# Patient Record
Sex: Female | Born: 1975
Health system: Southern US, Community
[De-identification: ages and names within clinical notes are randomized; demographics above are authoritative.]

## PROBLEM LIST (undated history)

## (undated) DIAGNOSIS — M1990 Unspecified osteoarthritis, unspecified site: Secondary | ICD-10-CM

## (undated) DIAGNOSIS — M3500 Sicca syndrome, unspecified: Secondary | ICD-10-CM

## (undated) DIAGNOSIS — Z87441 Personal history of nephrotic syndrome: Secondary | ICD-10-CM

## (undated) DIAGNOSIS — M47819 Spondylosis without myelopathy or radiculopathy, site unspecified: Secondary | ICD-10-CM

## (undated) DIAGNOSIS — Z87448 Personal history of other diseases of urinary system: Secondary | ICD-10-CM

## (undated) DIAGNOSIS — I7300 Raynaud's syndrome without gangrene: Secondary | ICD-10-CM

## (undated) DIAGNOSIS — M461 Sacroiliitis, not elsewhere classified: Secondary | ICD-10-CM

## (undated) DIAGNOSIS — F419 Anxiety disorder, unspecified: Secondary | ICD-10-CM

## (undated) DIAGNOSIS — R768 Other specified abnormal immunological findings in serum: Secondary | ICD-10-CM

## (undated) DIAGNOSIS — Z79899 Other long term (current) drug therapy: Secondary | ICD-10-CM

## (undated) DIAGNOSIS — M79643 Pain in unspecified hand: Secondary | ICD-10-CM

## (undated) DIAGNOSIS — M79673 Pain in unspecified foot: Secondary | ICD-10-CM

## (undated) DIAGNOSIS — E785 Hyperlipidemia, unspecified: Secondary | ICD-10-CM

## (undated) DIAGNOSIS — K5190 Ulcerative colitis, unspecified, without complications: Secondary | ICD-10-CM

## (undated) HISTORY — DX: Sjogren syndrome, unspecified: M35.00

## (undated) HISTORY — DX: Other specified abnormal immunological findings in serum: R76.8

## (undated) HISTORY — PX: COLONOSCOPY: SHX174

## (undated) HISTORY — DX: Pain in unspecified foot: M79.673

## (undated) HISTORY — DX: Pain in unspecified hand: M79.643

## (undated) HISTORY — DX: Other long term (current) drug therapy: Z79.899

## (undated) HISTORY — DX: Personal history of other diseases of urinary system: Z87.448

## (undated) HISTORY — PX: TUBAL LIGATION: SHX77

## (undated) HISTORY — DX: Spondylosis without myelopathy or radiculopathy, site unspecified: M47.819

## (undated) HISTORY — DX: Personal history of nephrotic syndrome: Z87.441

## (undated) HISTORY — DX: Sacroiliitis, not elsewhere classified: M46.1

## (undated) HISTORY — DX: Raynaud's syndrome without gangrene: I73.00

## (undated) HISTORY — DX: Hyperlipidemia, unspecified: E78.5

## (undated) HISTORY — DX: Unspecified osteoarthritis, unspecified site: M19.90

## (undated) HISTORY — DX: Anxiety disorder, unspecified: F41.9

## (undated) HISTORY — DX: Ulcerative colitis, unspecified, without complications: K51.90

---
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
## 2001-03-18 HISTORY — PX: TUBAL LIGATION: SHX77

## 2010-03-18 DIAGNOSIS — M1990 Unspecified osteoarthritis, unspecified site: Secondary | ICD-10-CM

## 2010-03-18 HISTORY — DX: Unspecified osteoarthritis, unspecified site: M19.90

## 2014-02-16 ENCOUNTER — Ambulatory Visit: Payer: Self-pay | Admitting: Internal Medicine

## 2014-02-16 ENCOUNTER — Encounter: Payer: Self-pay | Admitting: *Deleted

## 2014-02-17 ENCOUNTER — Encounter: Payer: Self-pay | Admitting: Nurse Practitioner

## 2014-02-17 ENCOUNTER — Encounter: Payer: 59 | Admitting: Nurse Practitioner

## 2014-02-27 NOTE — Progress Notes (Signed)
This encounter was created in error - please disregard.

## 2014-03-03 ENCOUNTER — Ambulatory Visit (INDEPENDENT_AMBULATORY_CARE_PROVIDER_SITE_OTHER): Payer: 59 | Admitting: Nurse Practitioner

## 2014-03-03 ENCOUNTER — Encounter: Payer: Self-pay | Admitting: Nurse Practitioner

## 2014-03-03 VITALS — BP 110/66 | HR 73 | Temp 98.9°F | Resp 10 | Wt 134.0 lb

## 2014-03-03 DIAGNOSIS — R87619 Unspecified abnormal cytological findings in specimens from cervix uteri: Secondary | ICD-10-CM

## 2014-03-03 DIAGNOSIS — Z1239 Encounter for other screening for malignant neoplasm of breast: Secondary | ICD-10-CM

## 2014-03-03 DIAGNOSIS — Z87448 Personal history of other diseases of urinary system: Secondary | ICD-10-CM

## 2014-03-03 DIAGNOSIS — M069 Rheumatoid arthritis, unspecified: Secondary | ICD-10-CM | POA: Insufficient documentation

## 2014-03-03 DIAGNOSIS — M3500 Sicca syndrome, unspecified: Secondary | ICD-10-CM | POA: Insufficient documentation

## 2014-03-03 DIAGNOSIS — Z87441 Personal history of nephrotic syndrome: Secondary | ICD-10-CM

## 2014-03-03 DIAGNOSIS — K5190 Ulcerative colitis, unspecified, without complications: Secondary | ICD-10-CM | POA: Insufficient documentation

## 2014-03-03 DIAGNOSIS — M1990 Unspecified osteoarthritis, unspecified site: Secondary | ICD-10-CM

## 2014-03-03 NOTE — Patient Instructions (Addendum)
Will get referral for gastroenterologist and gynecologist Will also set up referral for mammogram and Dexa scan (bone density)  Follow up in 3 months, make am appt and fast prior to appt (no eating or drinking except black coffee and water after midnight)   Claritin (loratadine) 10 mg daily for allergies   Sndrome de Sjgren (Sjgren's Syndrome) El sndrome de Sjgren es una enfermedad en la que el sistema natural de defensa del organismo (sistema inmunitario) ataca a las propias clulas del cuerpo y a las glndulas que producen lgrimas y saliva. A veces se lo relaciona con trastornos reumticos, como artritis reumatoidea y lupus. Es 10 veces ms comn entre las mujeres que en hombres. [employer] de las personas con el sndrome de Sjgren tienen entre 46 y 54 aos. CAUSAS  La causa es desconocida. Es un problema hereditario. Es posible que un factor desencadenante, como una infeccin viral, pueda activarlo. SNTOMAS  Los principales sntomas son:  Melina Copa en la boca.  La boca se siente terrosa, como si estuviese llena de algodn.  Dificultad para tragar, hablar o saborear.  Propensin a caries e infecciones bucales.  Ojos secos.  Ardor, picazn, sensacin de Economist.  Visin borrosa.  Sensibilidad a Naval architect. Otros sntomas son:  [employer] piel, la nariz y la vagina.  Dolor en las articulaciones, rigidez y Marketing executive. Otros rganos que pueden verse afectados incluyen:  Riones.  Vasos sanguneos.  Pulmones.  Hgado.  Pncreas.  Cerebro. DIAGNSTICO  El diagnstico primero se basa en los sntomas, la historia clnica y el examen fsico. Algunas pruebas pueden ser de ayuda para realizar el diagnstico. Algunas de ellas son:  Valarie Merino de produccin de lgrimas (prueba de Schirmer).  Un examen ocular minucioso en el que se utiliza un dispositivo de aumento (examen con lmpara de hendidura).  Prueba para observar el alcance del dao ocular  utilizando una tincin.  Examen bucal para detectar la presencia de signos de hinchazn de las glndulas salivales y sequedad en la boca.  Extirpacin de una glndula salival menor desde el interior del labio inferior, a fin de estudiarla con microscopio (biopsia labial).  Anlisis de [place]. Se llevarn a cabo y se controlarn anlisis de sangre especiales y [place]. Pueden presentarse anticuerpos que atacan tejidos normales (autoanticuerpos), como anticuerpos antinucleares (AAN), factores reumatoideos y anticuerpos del sndrome de Sjgren (anti-SSA y anti-SSB).  Radiografa de trax.  Anlisis de [place]. TRATAMIENTO  No se conoce la cura para este sndrome. Tampoco existe un tratamiento especfico para restablecer la secrecin glandular. El tratamiento vara y, generalmente, se basa en los sntomas presentes.  Las terapias con sustitutos de humedad pueden [employer] sntomas de sequedad.  Se pueden utilizar antiinflamatorios no esteroideos (AINE), como el ibuprofeno, para tratar sntomas musculoesquelticos.  Los corticoides, como la prednisona, se pueden Architectural technologist a Engineer, manufacturing con Doctor, hospital.  Se pueden recetar medicamentos inmunosupresores para controlar la sobreactividad del sistema inmunitario. En casos graves, esta sobreactividad puede daar rganos.  Se puede considerar un procedimiento quirrgico denominado oclusin puntual. Se realiza para cerrar los conductos que drenan las lgrimas, ayudando a Theatre manager ms lgrimas naturales en la superficie del ojo. Un pequeo porcentaje de las personas con sndrome de Film/video editor linfoma. Si est preocupado porque podra desarrollar linfoma, hable con el mdico a fin de conocer ms sobre la enfermedad y los sntomas que se deben Programme researcher, broadcasting/film/video. INSTRUCCIONES PARA EL CUIDADO EN EL HOGAR   Cuidado de los ojos:  Use gotas oftlmicas  segn lo especificado por el mdico.  Trate de parpadear 5 a 6 veces por  minuto.  Proteja los ojos de corrientes de aire y brizas.  Mantenga el aire apropiadamente humidificado.  Evite el humo.  Cuidado de la boca:  La goma de Higher education careers adviser sin azcar y los caramelos duros pueden ayudar a Psychologist, clinical.  Tome sorbos de agua con frecuencia o bebidas sin azcar.  Use blsamo labial, sustitutos de saliva y medicamentos recetados, segn las indicaciones. SOLICITE ATENCIN MDICA SI:   Su temperatura bucal es de ms de 38,9 C (102 F).  Transpira por las noches.  Tiene fatiga constante.  Pierde peso de [place] inexplicable.  Siente picazn.  Tiene manchas rojas en la piel. PARA OBTENER MS INFORMACIN  Fundacin del Sndrome de Sjgren (Sjgren's Syndrome Foundation): www.sjogrens.Mercer para la Artritis y las Enfermedades Musculoesquelticas y Insurance underwriter [facility] of Arthritis and Musculoskeletal and Skin Diseases): www.niams.SouthExposed.es. Document Released: 12/23/2012 [facility] Patient Information 2015 [place]. This information is not intended to replace advice given to you by your health care provider. Make sure you discuss any questions you have with your health care provider.

## 2014-03-03 NOTE — Progress Notes (Signed)
Patient ID: Laura Finley, female   DOB: 1975-08-10, 38 y.o.   MRN: [id number] .    PCP: Lauree Chandler, NP  Allergies  Allergen Reactions  . Iodine     Chief Complaint  Patient presents with  . Establish Care    New patient establish care: Request GI referral for ulcerative colitis  . Referral    GYN referral      HPI: Patient is a 38 y.o. female seen in the office today to establish care.  Pt from [place]  and needing to establish care here. Was relocated due to husbands job.  Needs referral to GYN and GI Had had abnormal PAP in the past - was supposed to follow up in 2 months (this was in June) she is now over due for this.  Already established with rheumatologist. Had labs when she went to rheumatologist -was dx with sjogren's syndrome.  Rheumatologist in [place] started her on folic acid so she conts now Following up wit rheumatologist in 2 month Last monogram last in may 2015- follow up in 6 month (past due now)  Exercising 3 times a week 30 mins-45 mins  Has been on prednisone off and on for the last 12 years Review of Systems:  Review of Systems  Constitutional: Negative for activity change, appetite change, fatigue and unexpected weight change.  HENT: Positive for congestion (at times due to allergies). Negative for hearing loss.   Eyes: Negative.        Dry eyes, using eye drops  Respiratory: Negative for cough and shortness of breath.   Cardiovascular: Negative for chest pain, palpitations and leg swelling.  Gastrointestinal: Positive for abdominal pain (mild). Negative for diarrhea and constipation.  Genitourinary: Negative for dysuria and difficulty urinating.  Musculoskeletal: Positive for joint swelling and arthralgias. Negative for myalgias.       Chronic pain, due to reactive arthritis   Skin: Negative for color change and wound.  Allergic/Immunologic: Positive for environmental allergies.  Neurological: Negative for dizziness  and weakness.  Psychiatric/Behavioral: Negative for behavioral problems, confusion and agitation. The patient is nervous/anxious.        Situational anxiety, moving here from [place]. Was working in [place], has no job getting use to the change.    Past Medical History  Diagnosis Date  . Arthritis 2012    reactive arthritis  . History of kidney disease as a child    History reviewed. No pertinent past surgical history. Social History:   reports that she has never smoked. She does not have any smokeless tobacco history on file. She reports that she does not drink alcohol or use illicit drugs.  Family History  Problem Relation Age of Onset  . Cancer Mother 81    Breast  . Cancer Father 52    bone  . Ulcerative colitis Brother 71    Medications: Patient's Medications  New Prescriptions   No medications on file  Previous Medications   ACETAMINOPHEN (TYLENOL) 500 MG TABLET    Take 1,000 mg by mouth every 12 (twelve) hours as needed.   ADALIMUMAB (HUMIRA) 40 MG/0.8ML PSKT    Inject 40 mg into the skin every 14 (fourteen) days.   BIOTIN (BIOTIN MAXIMUM STRENGTH) 10 MG TABS    Take 10 mg by mouth daily.   FOLIC ACID (FOLVITE) 1 MG TABLET    Take 1 mg by mouth daily.  Modified Medications   No medications on file  Discontinued Medications   IBUPROFEN (  ADVIL,MOTRIN) 200 MG TABLET    Take 400 mg by mouth every 12 (twelve) hours as needed.   METHYLPREDNISOLONE (MEDROL) 16 MG TABLET    Take 16 mg by mouth daily as needed.   PREDNISONE (DELTASONE) 5 MG TABLET    Take 5 mg by mouth daily with breakfast.     Physical Exam:  Filed Vitals:   03/03/14 0909  BP: 110/66  Pulse: 73  Temp: 98.9 F (37.2 C)  TempSrc: Oral  Resp: 10  Weight: 134 lb (60.782 kg)  SpO2: 96%    Physical Exam  Constitutional: She is oriented to person, place, and time. She appears well-developed and well-nourished. No distress.  HENT:  Head: Normocephalic and atraumatic.  Mouth/Throat:  Oropharynx is clear and moist. No oropharyngeal exudate.  Eyes: Conjunctivae are normal. Pupils are equal, round, and reactive to light.  Neck: Normal range of motion. Neck supple.  Cardiovascular: Normal rate, regular rhythm and normal heart sounds.   Pulmonary/Chest: Effort normal and breath sounds normal.  Abdominal: Soft. Bowel sounds are normal.  Musculoskeletal: She exhibits no edema or tenderness.  Neurological: She is alert and oriented to person, place, and time.  Skin: Skin is warm and dry. She is not diaphoretic.  Psychiatric: She has a normal mood and affect.    Labs reviewed: Basic Metabolic Panel: No results for input(s): NA, K, CL, CO2, GLUCOSE, BUN, CREATININE, CALCIUM, MG, PHOS, TSH in the last 8760 hours. Liver Function Tests: No results for input(s): AST, ALT, ALKPHOS, BILITOT, PROT, ALBUMIN in the last 8760 hours. No results for input(s): LIPASE, AMYLASE in the last 8760 hours. No results for input(s): AMMONIA in the last 8760 hours. CBC: No results for input(s): WBC, NEUTROABS, HGB, HCT, MCV, PLT in the last 8760 hours. Lipid Panel: No results for input(s): CHOL, HDL, LDLCALC, TRIG, CHOLHDL, LDLDIRECT in the last 8760 hours. TSH: No results for input(s): TSH in the last 8760 hours. A1C: No results found for: HGBA1C   Assessment/Plan  1. Arthritis Reactive arthritis followed by rheumatology, on humira - will follow up HM DEXA SCAN due to chronic prednisone use  2. Ulcerative colitis, without complications -without symptoms at this time except for slight left lower abdominal discomfort, not pain, bowels moving regularly.  - Ambulatory referral to Gastroenterology  3. Sjogren's syndrome Recently diagnosed Following with rhuematology  4. History of kidney disease as a child -was following with urology [place] but resolved  5. Abnormal cervical Papanicolaou smear, unspecified abnormal pap finding - Ambulatory referral to Gynecology  6. Breast  cancer screening - MM DIGITAL SCREENING BILATERAL; Future  7. Allergic rhinitis  To use claritin 10 mg daily   Next visit will come fasting, will get fasting lipid, cmp, cbc

## 2014-03-07 ENCOUNTER — Ambulatory Visit: Payer: Self-pay | Admitting: Gastroenterology

## 2014-03-07 NOTE — Progress Notes (Signed)
Patient ID: Laura Finley, female   DOB: 22-Aug-1975, 38 y.o.   MRN: [id number]     Patient no-showed today's appointment; provider notified for review of record.     Please send a no-show letter to the patient recommending that they reschedule the appointment.   Also send a letter to the referring provider alerting them of the NO SHOW.

## 2014-03-31 ENCOUNTER — Other Ambulatory Visit: Payer: Self-pay | Admitting: Nurse Practitioner

## 2014-03-31 DIAGNOSIS — N63 Unspecified lump in unspecified breast: Secondary | ICD-10-CM

## 2014-04-25 ENCOUNTER — Encounter: Payer: Self-pay | Admitting: Gastroenterology

## 2014-04-25 ENCOUNTER — Ambulatory Visit (INDEPENDENT_AMBULATORY_CARE_PROVIDER_SITE_OTHER): Payer: 59 | Admitting: Gastroenterology

## 2014-04-25 VITALS — BP 110/60 | HR 87 | Ht 65.0 in | Wt 135.4 lb

## 2014-04-25 DIAGNOSIS — K5190 Ulcerative colitis, unspecified, without complications: Secondary | ICD-10-CM

## 2014-04-25 MED ORDER — HYOSCYAMINE SULFATE 0.125 MG SL SUBL: SUBLINGUAL_TABLET | SUBLINGUAL | Status: DC

## 2014-04-25 NOTE — Patient Instructions (Addendum)
Avoid NSAIDs as best that you can. Please return to see Dr. Ardis Hughs in 2-3 months, sooner if needed. Continue your humira every other week, 14m. Have a copy any labs sent here for review. New prescription for antispasm med called in.

## 2014-04-25 NOTE — Progress Notes (Signed)
HPI: This is a  very pleasant 39 year old woman whom I'm meeting for the first time today. She and her husband recently moved here from [place]. He is here with her for the visit today.  Colonoscopy January 2014, [place], described to have a normal cecum and ileocecal valve, there was minimal erythema throughout the colon, random biopsies were taken. Biopsies showed active ulcerative colitis without dysplasia. Colonoscopy September 2012 in [place], described as having a normal terminal ileum. TI was biopsied to rule out inflammation. There was mild erythema and granularity throughout the entire colon. Random biopsies were taken. Biopsies showed mild chronic ileitis and active colitis. Colonoscopy in 2003 and 2005. These were both in [place]. I only have hand written reports and they are completely illegible. A biopsy from the 2003 colonoscopy does show chronic, active colitis.   Was pregnant, diagnosed with UC in 2003. First med with asocol and rowasa.  Then off meds for 8 years without any symptoms (no diarrhea). Started to flare in 2012 (bloody diarrhea), was on steroids for about a year, restarted asacol.  Reactive arthritis (migratory large joints).  She started on humira 2012 by rheumtologist. Everything improved (joints and GI symptoms).  See's Laura Headland, MD. She is prescribing humira.  Saw her even last weeks.  Next visit in 6 months.  Bowels once to twice a day, non bloody, minor left sided abd pains.  No colon cancer  Blood work dated October 2015 show positive ANA, very elevated Sjogren's anti-SS-a, homogeneous pattern of the ANA to a very high titer of 03-1278. Quant if you're on gold TB testing was negative. Hepatitis B surface antigen was negative, hepatitis be surface antibody was nonreactive. Sedimentation rate was negative normal.  Two children 26 yo, 45 yo. Goes to [employer].  Review of systems: Pertinent positive and negative review of systems were noted in  the above HPI section. Complete review of systems was performed and was otherwise normal.    Past Medical History  Diagnosis Date  . Arthritis 2012    reactive arthritis  . History of kidney disease as a child   . Ulcerative colitis   . Sjogren's syndrome     Past Surgical History  Procedure Laterality Date  . Tubal ligation      Current Outpatient Prescriptions  Medication Sig Dispense Refill  . acetaminophen (TYLENOL) 500 MG tablet Take 1,000 mg by mouth every 12 (twelve) hours as needed.    . Adalimumab (HUMIRA) 40 MG/0.8ML PSKT Inject 40 mg into the skin every 14 (fourteen) days.    . Biotin (BIOTIN MAXIMUM STRENGTH) 10 MG TABS Take 10 mg by mouth daily.    . folic acid (FOLVITE) 1 MG tablet Take 1 mg by mouth daily.     No current facility-administered medications for this visit.    Allergies as of 04/25/2014 - Review Complete 04/25/2014  Allergen Reaction Noted  . Iodine  02/16/2014    Family History  Problem Relation Age of Onset  . Cancer Mother 52    Breast  . Hypertension Mother   . Cancer Father 18    bone  . Ulcerative colitis Brother 76  . Diabetes Brother   . Diabetes Maternal Grandmother     History   Social History  . Marital Status: Married    Spouse Name: N/A    Number of Children: N/A  . Years of Education: N/A   Occupational History  . Not on file.   Social History Main Topics  .  Smoking status: Never Smoker   . Smokeless tobacco: Not on file  . Alcohol Use: No     Comment: occ glass of wine  . Drug Use: No  . Sexual Activity: Yes    Birth Control/ Protection: Surgical   Other Topics Concern  . Not on file   Social History Narrative       Physical Exam: BP 110/60 mmHg  Pulse 87  Ht 5' 5"  (1.651 m)  Wt 135 lb 6.4 oz (61.417 kg)  BMI 22.53 kg/m2  SpO2 99% Constitutional: generally well-appearing Psychiatric: alert and oriented x3 Eyes: extraocular movements intact Mouth: oral pharynx moist, no lesions Neck: supple  no lymphadenopathy Cardiovascular: heart regular rate and rhythm Lungs: clear to auscultation bilaterally Abdomen: soft, nontender, nondistended, no obvious ascites, no peritoneal signs, normal bowel sounds Extremities: no lower extremity edema bilaterally Skin: no lesions on visible extremities    Assessment and plan: 39 y.o. female with  ulcerative colitis, in very good clinical remission on Humira every other week  She is currently on biologic therapy and this is helping her ulcerative colitis as well as her reactive arthritis, Sjogren's syndrome. I see no reason to change her medicines currently. She will return to see me in 2-3 months and sooner if needed. She does have some mild left-sided spasm-like abdominal pains and I'm calling her in a prescription for sublingual antispasm medicines. She knows to avoid NSAIDs as best that she can.   At some point she will probably need repeat colonoscopy for high-risk screening. She was diagnosed originally in 2003 and so she is about 13 years from her onset of disease. Probably the next 2-3 years like to repeat colonoscopy for screening.

## 2014-05-30 ENCOUNTER — Other Ambulatory Visit: Payer: 59

## 2014-05-30 ENCOUNTER — Other Ambulatory Visit: Payer: Self-pay | Admitting: *Deleted

## 2014-05-30 DIAGNOSIS — M3500 Sicca syndrome, unspecified: Secondary | ICD-10-CM

## 2014-05-30 DIAGNOSIS — Z87441 Personal history of nephrotic syndrome: Secondary | ICD-10-CM

## 2014-05-30 DIAGNOSIS — K5190 Ulcerative colitis, unspecified, without complications: Secondary | ICD-10-CM

## 2014-05-31 LAB — COMPREHENSIVE METABOLIC PANEL
ALK PHOS: 60 IU/L (ref 39–117)
ALT: 10 IU/L (ref 0–32)
AST: 16 IU/L (ref 0–40)
Albumin/Globulin Ratio: 1.7 (ref 1.1–2.5)
Albumin: 4.6 g/dL (ref 3.5–5.5)
BUN / CREAT RATIO: 17 (ref 8–20)
BUN: 13 mg/dL (ref 6–20)
Bilirubin Total: 0.3 mg/dL (ref 0.0–1.2)
CO2: 20 mmol/L (ref 18–29)
Calcium: 9.8 mg/dL (ref 8.7–10.2)
Chloride: 103 mmol/L (ref 97–108)
Creatinine, Ser: 0.78 mg/dL (ref 0.57–1.00)
GFR calc non Af Amer: 97 mL/min/{1.73_m2} (ref >59)
GFR, EST AFRICAN AMERICAN: 112 mL/min/{1.73_m2} (ref >59)
GLUCOSE: 85 mg/dL (ref 65–99)
Globulin, Total: 2.7 g/dL (ref 1.5–4.5)
Potassium: 4.1 mmol/L (ref 3.5–5.2)
Sodium: 138 mmol/L (ref 134–144)
Total Protein: 7.3 g/dL (ref 6.0–8.5)

## 2014-05-31 LAB — CBC WITH DIFFERENTIAL/PLATELET
BASOS ABS: 0 10*3/uL (ref 0.0–0.2)
Basos: 0 %
Eos: 3 %
Eosinophils Absolute: 0.1 10*3/uL (ref 0.0–0.4)
HCT: 40.1 % (ref 34.0–46.6)
Hemoglobin: 13 g/dL (ref 11.1–15.9)
Immature Grans (Abs): 0 10*3/uL (ref 0.0–0.1)
Immature Granulocytes: 0 %
LYMPHS ABS: 1.7 10*3/uL (ref 0.7–3.1)
Lymphs: 35 %
MCH: 26.6 pg (ref 26.6–33.0)
MCHC: 32.4 g/dL (ref 31.5–35.7)
MCV: 82 fL (ref 79–97)
Monocytes Absolute: 0.4 10*3/uL (ref 0.1–0.9)
Monocytes: 7 %
Neutrophils Absolute: 2.8 10*3/uL (ref 1.4–7.0)
Neutrophils Relative %: 55 %
Platelets: 225 10*3/uL (ref 150–379)
RBC: 4.89 x10E6/uL (ref 3.77–5.28)
RDW: 14.9 % (ref 12.3–15.4)
WBC: 5 10*3/uL (ref 3.4–10.8)

## 2014-05-31 LAB — LIPID PANEL
CHOL/HDL RATIO: 3.1 ratio (ref 0.0–4.4)
CHOLESTEROL TOTAL: 179 mg/dL (ref 100–199)
HDL: 58 mg/dL (ref >39)
LDL Calculated: 105 mg/dL — ABNORMAL HIGH (ref 0–99)
Triglycerides: 80 mg/dL (ref 0–149)
VLDL CHOLESTEROL CAL: 16 mg/dL (ref 5–40)

## 2014-06-02 ENCOUNTER — Encounter: Payer: Self-pay | Admitting: Nurse Practitioner

## 2014-06-02 ENCOUNTER — Ambulatory Visit (INDEPENDENT_AMBULATORY_CARE_PROVIDER_SITE_OTHER): Payer: 59 | Admitting: Nurse Practitioner

## 2014-06-02 VITALS — BP 110/66 | HR 75 | Temp 98.6°F | Resp 12 | Ht 65.0 in | Wt 132.0 lb

## 2014-06-02 DIAGNOSIS — K5190 Ulcerative colitis, unspecified, without complications: Secondary | ICD-10-CM

## 2014-06-02 DIAGNOSIS — E785 Hyperlipidemia, unspecified: Secondary | ICD-10-CM | POA: Diagnosis not present

## 2014-06-02 DIAGNOSIS — H578 Other specified disorders of eye and adnexa: Secondary | ICD-10-CM | POA: Diagnosis not present

## 2014-06-02 DIAGNOSIS — Z1239 Encounter for other screening for malignant neoplasm of breast: Secondary | ICD-10-CM | POA: Diagnosis not present

## 2014-06-02 DIAGNOSIS — F411 Generalized anxiety disorder: Secondary | ICD-10-CM

## 2014-06-02 DIAGNOSIS — H5789 Other specified disorders of eye and adnexa: Secondary | ICD-10-CM

## 2014-06-02 DIAGNOSIS — M1990 Unspecified osteoarthritis, unspecified site: Secondary | ICD-10-CM

## 2014-06-02 NOTE — Patient Instructions (Signed)
Please see ophthalmologist for next visit  [facility]: Deliah Goody MD ? WebsiteDirections Doctor Address: [address], [place], [place] [address] [phone]  Or   Suann Larry MD ? [facility]irections Ophthalmologist Address: [address], [place], [place] [address] [phone]  Follow up in 1 year or sooner if needed

## 2014-06-02 NOTE — Progress Notes (Signed)
Patient ID: Laura Finley, female   DOB: [birth year], 39 y.o.   MRN: [id number]    PCP: Lauree Chandler, NP  Allergies  Allergen Reactions  . Iodine     Chief Complaint  Patient presents with  . Medical Management of Chronic Issues    3 month follow-up      HPI: Patient is a 39 y.o. female seen in the office today for routine follow up. Followed up by Dr Ardis Hughs, GI. No abdominal pain noted. Eating better so she does not have GI side effects. No diarrhea or constipation.  No joint pain. Seeing rheumatologist every 3 months but now every 6 months since she is doing well. Reports recent labs from rheumatology were good.   Had worsening red splotches, went to unscented lotions and detergents, now they are better-- rheumatologist though it may be psoriasis Followed up with GYN, had breast exam Scheduled for mammogram with GYN office which will be done in may Her mother had hx of breast cancer 12 years ago, now has mets to her bones, freq travels back home to see her.   Review of Systems:  Review of Systems  Constitutional: Negative for activity change, appetite change, fatigue and unexpected weight change.  HENT: Negative for congestion (at times due to allergies) and hearing loss.   Eyes: Negative.        Dry eyes, using eye drops  Respiratory: Negative for cough and shortness of breath.   Cardiovascular: Negative for chest pain, palpitations and leg swelling.  Gastrointestinal: Negative for abdominal pain, diarrhea and constipation.  Genitourinary: Negative for dysuria and difficulty urinating.  Musculoskeletal: Positive for joint swelling and arthralgias. Negative for myalgias.       Chronic pain, due to reactive arthritis   Skin: Negative for color change and wound.  Allergic/Immunologic: Positive for environmental allergies.  Neurological: Negative for dizziness and weakness.  Psychiatric/Behavioral: Negative for behavioral problems, confusion and agitation. The  patient is nervous/anxious.        Situational anxiety, due to move and mother who has cancer    Past Medical History  Diagnosis Date  . Arthritis 2012    reactive arthritis  . History of kidney disease as a child   . Ulcerative colitis   . Sjogren's syndrome    Past Surgical History  Procedure Laterality Date  . Tubal ligation     Social History:   reports that she has never smoked. She does not have any smokeless tobacco history on file. She reports that she does not drink alcohol or use illicit drugs.  Family History  Problem Relation Age of Onset  . Cancer Mother 29    Breast  . Hypertension Mother   . Cancer Father 60    bone  . Ulcerative colitis Brother 88  . Diabetes Brother   . Diabetes Maternal Grandmother     Medications: Patient's Medications  New Prescriptions   No medications on file  Previous Medications   ACETAMINOPHEN (TYLENOL) 500 MG TABLET    Take 1,000 mg by mouth every 12 (twelve) hours as needed.   ADALIMUMAB (HUMIRA) 40 MG/0.8ML PSKT    Inject 40 mg into the skin every 14 (fourteen) days.   BIOTIN (BIOTIN MAXIMUM STRENGTH) 10 MG TABS    Take 10 mg by mouth daily.   FOLIC ACID (FOLVITE) 1 MG TABLET    Take 1 mg by mouth daily.   HYOSCYAMINE (LEVSIN SL) 0.125 MG SL TABLET    Take 1-2 tablets by mouth  or under tongue every 4-6 hours as needed  Modified Medications   No medications on file  Discontinued Medications   No medications on file     Physical Exam:  Filed Vitals:   06/02/14 1118  BP: 110/66  Pulse: 75  Temp: 98.6 F (37 C)  TempSrc: Oral  Resp: 12  Height: 5' 5"  (1.651 m)  Weight: 132 lb (59.875 kg)  SpO2: 99%    Physical Exam  Constitutional: She is oriented to person, place, and time. She appears well-developed and well-nourished. No distress.  HENT:  Head: Normocephalic and atraumatic.  Mouth/Throat: Oropharynx is clear and moist. No oropharyngeal exudate.  Eyes: Conjunctivae are normal. Pupils are equal, round, and  reactive to light.  Neck: Normal range of motion. Neck supple.  Cardiovascular: Normal rate, regular rhythm and normal heart sounds.   Pulmonary/Chest: Effort normal and breath sounds normal.  Abdominal: Soft. Bowel sounds are normal.  Musculoskeletal: She exhibits no edema or tenderness.  Neurological: She is alert and oriented to person, place, and time.  Skin: Skin is warm and dry. She is not diaphoretic.  Psychiatric: She has a normal mood and affect.    Labs reviewed: Basic Metabolic Panel:  Recent Labs  05/30/14 0919  NA 138  K 4.1  CL 103  CO2 20  GLUCOSE 85  BUN 13  CREATININE 0.78  CALCIUM 9.8   Liver Function Tests:  Recent Labs  05/30/14 0919  AST 16  ALT 10  ALKPHOS 60  BILITOT 0.3  PROT 7.3   No results for input(s): LIPASE, AMYLASE in the last 8760 hours. No results for input(s): AMMONIA in the last 8760 hours. CBC:  Recent Labs  05/30/14 0919  WBC 5.0  NEUTROABS 2.8  HGB 13.0  HCT 40.1  MCV 82  PLT 225   Lipid Panel:  Recent Labs  05/30/14 0919  CHOL 179  HDL 58  LDLCALC 105*  TRIG 80  CHOLHDL 3.1   TSH: No results for input(s): TSH in the last 8760 hours. A1C: No results found for: HGBA1C   Assessment/Plan 1. Ulcerative colitis, without complications -well controlled, following with GI  2. Arthritis conts to follow up with rheumatology, currently without symptoms    3. Breast cancer screening -following with GYN for PAP and routine exa and plans to get Mammogram at GYN office  4. Anxiety state -due to move and mother with breast cancer with mets, plan to go back to [place] to visit her frequently, coping appropriately.    5. Hyperlipidemia LDL slightly elevated, reviewed lipid panel and diet and exercise goals.  Lifestyle modifications for now, will follow up   6. Red eyes -reports red eyes for some time now. Does not bother her. No visual changes, recently went to optometrist for last visit -recommended  ophthalmologist for next eye exam  Follow up in 1 years, sooner if needed

## 2014-06-05 DIAGNOSIS — E785 Hyperlipidemia, unspecified: Secondary | ICD-10-CM | POA: Insufficient documentation

## 2014-06-05 DIAGNOSIS — F411 Generalized anxiety disorder: Secondary | ICD-10-CM | POA: Insufficient documentation

## 2014-06-05 DIAGNOSIS — Z1239 Encounter for other screening for malignant neoplasm of breast: Secondary | ICD-10-CM | POA: Insufficient documentation

## 2014-10-11 ENCOUNTER — Other Ambulatory Visit: Payer: Self-pay | Admitting: Gynecology

## 2014-10-11 DIAGNOSIS — N6320 Unspecified lump in the left breast, unspecified quadrant: Principal | ICD-10-CM

## 2014-10-11 DIAGNOSIS — N6325 Unspecified lump in the left breast, overlapping quadrants: Secondary | ICD-10-CM

## 2014-10-11 DIAGNOSIS — Z803 Family history of malignant neoplasm of breast: Secondary | ICD-10-CM

## 2014-10-17 ENCOUNTER — Other Ambulatory Visit: Payer: 59

## 2014-10-20 ENCOUNTER — Ambulatory Visit
Admission: RE | Admit: 2014-10-20 | Discharge: 2014-10-20 | Disposition: A | Discharge status: Home / Self Care | Payer: 59 | Source: Ambulatory Visit | Attending: Gynecology | Admitting: Gynecology

## 2014-10-20 ENCOUNTER — Other Ambulatory Visit: Payer: Self-pay | Admitting: Gynecology

## 2014-10-20 DIAGNOSIS — N6320 Unspecified lump in the left breast, unspecified quadrant: Principal | ICD-10-CM

## 2014-10-20 DIAGNOSIS — Z803 Family history of malignant neoplasm of breast: Secondary | ICD-10-CM

## 2014-10-20 DIAGNOSIS — N6325 Unspecified lump in the left breast, overlapping quadrants: Secondary | ICD-10-CM

## 2015-03-17 ENCOUNTER — Encounter: Payer: Self-pay | Admitting: Gastroenterology

## 2015-03-17 ENCOUNTER — Ambulatory Visit (INDEPENDENT_AMBULATORY_CARE_PROVIDER_SITE_OTHER): Payer: 59 | Admitting: Gastroenterology

## 2015-03-17 VITALS — BP 100/70 | HR 72 | Ht 65.0 in | Wt 135.6 lb

## 2015-03-17 DIAGNOSIS — K5190 Ulcerative colitis, unspecified, without complications: Secondary | ICD-10-CM

## 2015-03-17 NOTE — Progress Notes (Signed)
Review of pertinent gastrointestinal problems: 1. Extensive ulcerative colitis: Was pregnant, diagnosed with UC in 2003. First med with asocol and rowasa. Then off meds for 8 years without any symptoms (no diarrhea). Started to flare in 2012 (bloody diarrhea), was on steroids for about a year, restarted asacol. Reactive arthritis (migratory large joints). She started on humira 2012 by rheumtologist. Everything improved (joints and GI symptoms).Colonoscopy January 2014, [place], described to have a normal cecum and ileocecal valve, there was minimal erythema throughout the colon, random biopsies were taken. Biopsies showed active ulcerative colitis without dysplasia. Colonoscopy September 2012 in [place], described as having a normal terminal ileum. TI was biopsied to rule out inflammation. There was mild erythema and granularity throughout the entire colon. Random biopsies were taken. Biopsies showed mild chronic ileitis and active colitis. Colonoscopy in 2003 and 2005. These were both in [place]. I only have hand written reports and they are completely illegible. A biopsy from the 2003 colonoscopy does show chronic, active colitis.   Established care Dr. Ardis Hughs 2016, no change in meds, continue humira every other week.    HPI: This is a    very pleasant 39 year old woman whom I last saw figure 2016. She is here with her husband today.  She continues on Humira every other week 40 mg. This was prescribed by her rheumatologist  Bowels are fine. No bleeding.  Minor diarrhea especially if cold out, or chilly in the house.    Usually solid formed BM once daily.  Loose stools intermittently about once a month, usually dietary related.   Her brother also has UC. Chief complaint is extensive ulcerative colitis   Past Medical History  Diagnosis Date  . Arthritis 2012    reactive arthritis  . History of kidney disease as a child   . Ulcerative colitis ([place])   . Sjogren's syndrome  [facility])     Past Surgical History  Procedure Laterality Date  . Tubal ligation      Current Outpatient Prescriptions  Medication Sig Dispense Refill  . acetaminophen (TYLENOL) 500 MG tablet Take 1,000 mg by mouth every 12 (twelve) hours as needed.    . Adalimumab (HUMIRA) 40 MG/0.8ML PSKT Inject 40 mg into the skin every 14 (fourteen) days.    . Biotin (BIOTIN MAXIMUM STRENGTH) 10 MG TABS Take 10 mg by mouth daily.    . folic acid (FOLVITE) 1 MG tablet Take 1 mg by mouth daily.    . hyoscyamine (LEVSIN SL) 0.125 MG SL tablet Take 1-2 tablets by mouth or under tongue every 4-6 hours as needed 50 tablet 6  . NON FORMULARY 5 HTP vitamin supplement     No current facility-administered medications for this visit.    Allergies as of 03/17/2015 - Review Complete 03/17/2015  Allergen Reaction Noted  . Iodine  02/16/2014    Family History  Problem Relation Age of Onset  . Cancer Mother 75    Breast  . Hypertension Mother   . Cancer Father 39    bone  . Ulcerative colitis Brother 81  . Diabetes Brother   . Diabetes Maternal Grandmother     Social History   Social History  . Marital Status: Married    Spouse Name: N/A  . Number of Children: N/A  . Years of Education: N/A   Occupational History  . Not on file.   Social History Main Topics  . Smoking status: Never Smoker   . Smokeless tobacco: Never Used  . Alcohol  Use: No     Comment: occ glass of wine  . Drug Use: No  . Sexual Activity: Yes    Birth Control/ Protection: Surgical   Other Topics Concern  . Not on file   Social History Narrative     Physical Exam: BP 100/70 mmHg  Pulse 72  Ht 5' 5"  (1.651 m)  Wt 135 lb 9.6 oz (61.508 kg)  BMI 22.57 kg/m2  LMP 02/23/2015 (Approximate) Constitutional: generally well-appearing Psychiatric: alert and oriented x3 Abdomen: soft, nontender, nondistended, no obvious ascites, no peritoneal signs, normal bowel sounds   Assessment and plan: 39 y.o. female with  extensive ulcerative colitis  Her bowel symptoms are well controlled on Humira every other week. She will continue this for now without any changes. The medicine has been prescribed by her rheumatologist as she also has a reactive arthritis. She will return to see me in 6 months and sooner if needed. She is probably due for high risk screening colonoscopy in the next year or 2.   Owens Loffler, MD [facility] 03/17/2015, 11:09 AM

## 2015-03-17 NOTE — Patient Instructions (Signed)
Please return to see Dr. Ardis Hughs in 6 months.  No changes in your humira.

## 2015-05-11 ENCOUNTER — Encounter: Payer: Self-pay | Admitting: Nurse Practitioner

## 2015-06-01 ENCOUNTER — Ambulatory Visit: Payer: 59 | Admitting: Nurse Practitioner

## 2015-06-28 ENCOUNTER — Encounter: Payer: Self-pay | Admitting: Gastroenterology

## 2015-08-29 ENCOUNTER — Encounter: Payer: 59 | Admitting: Nurse Practitioner

## 2015-09-11 ENCOUNTER — Encounter: Payer: Self-pay | Admitting: Nurse Practitioner

## 2015-09-11 ENCOUNTER — Ambulatory Visit (INDEPENDENT_AMBULATORY_CARE_PROVIDER_SITE_OTHER): Payer: 59 | Admitting: Nurse Practitioner

## 2015-09-11 VITALS — BP 118/68 | HR 70 | Temp 98.3°F | Resp 18 | Ht 65.0 in | Wt 130.6 lb

## 2015-09-11 DIAGNOSIS — Z0000 Encounter for general adult medical examination without abnormal findings: Secondary | ICD-10-CM | POA: Diagnosis not present

## 2015-09-11 NOTE — Progress Notes (Signed)
Patient ID: Melissa Montane, female   DOB: 07/31/1975, 40 y.o.   MRN: [id number]    PCP: Lauree Chandler, NP  Advanced Directive information Does patient have an advance directive?: No, Would patient like information on creating an advanced directive?: No - patient declined information  Allergies  Allergen Reactions  . Iodine     Chief Complaint  Patient presents with  . Medical Management of Chronic Issues    Complete physical      HPI: Patient is a 40 y.o. female seen in the office today for wellness exam Screenings:  Breast Cancer- mammogram done 02/2015 Cervical Cancer- PAP done 04/2015, negative   Depression screening Depression screen [facility] 2/9 09/11/2015 03/03/2014  Decreased Interest 0 0  Down, Depressed, Hopeless 0 0  PHQ - 2 Score 0 0   Falls Fall Risk  09/11/2015 06/02/2014 03/03/2014  Falls in the past year? No No No   MMSE No flowsheet data found. Vaccines  There is no immunization history on file for this patient. Opts not to get vaccinations  Smoking status:.never Alcohol use: may have a glass of wine once a week  Dentist: every 6 months Ophthalmologist: no changes in vision, does not go to see ophthalmology  Exercise regimen: was exercising but hip starting hurting and required cortisone injection, started walking 1 month ago. Walking over an hour, 4 times a week Diet: attempts to eat healthy and clean, avoids red meat as much as possible  Specialist GYN: Dr Lynnette Caffey GI: Dr Ardis Hughs Rheumatologist: Dr Evelena Asa like something is missing, feeling of sadness, she has been staying at home which is new for her. Has always worked. Husband was relocated  Language is a struggle because she does not feel comfortable speaking english but has taking classes and does well with that Her mother has cancer Thinking about talking to a therapist/going to counseling, has been trying to cope and feels like she is doing okay with that. Sometimes she  just wants to move back home but her husband and children are here. She has a 15 year old and a 40 year old. 40 year old going to college here which she is excited about.     Review of Systems:  Review of Systems  Constitutional: Negative for activity change, appetite change, fatigue and unexpected weight change.  HENT: Negative for congestion (at times due to allergies) and hearing loss.   Eyes: Negative.        Dry eyes, using eye drops  Respiratory: Negative for cough and shortness of breath.   Cardiovascular: Negative for chest pain, palpitations and leg swelling.  Gastrointestinal: Negative for abdominal pain, diarrhea and constipation.  Genitourinary: Negative for dysuria and difficulty urinating.  Musculoskeletal: Positive for joint swelling and arthralgias. Negative for myalgias.       Chronic pain, due to reactive arthritis   Skin: Negative for color change and wound.  Allergic/Immunologic: Positive for environmental allergies.  Neurological: Negative for dizziness and weakness.  Psychiatric/Behavioral: Negative for behavioral problems, confusion and agitation. The patient is nervous/anxious.        Situational anxiety and depression, due to move and mother who has cancer    Past Medical History  Diagnosis Date  . Arthritis 2012    reactive arthritis  . History of kidney disease as a child   . Ulcerative colitis ([place])   . Sjogren's syndrome [facility])    Past Surgical History  Procedure Laterality Date  . Tubal ligation  Social History:   reports that she has never smoked. She has never used smokeless tobacco. She reports that she does not drink alcohol or use illicit drugs.  Family History  Problem Relation Age of Onset  . Cancer Mother 58    Breast  . Hypertension Mother   . Cancer Father 14    bone  . Ulcerative colitis Brother 42  . Diabetes Brother   . Diabetes Maternal Grandmother     Medications: Patient's Medications  New Prescriptions   No  medications on file  Previous Medications   ACETAMINOPHEN (TYLENOL) 500 MG TABLET    Take 1,000 mg by mouth every 12 (twelve) hours as needed.   ADALIMUMAB (HUMIRA) 40 MG/0.8ML PSKT    Inject 40 mg into the skin every 14 (fourteen) days.   BIOTIN (BIOTIN MAXIMUM STRENGTH) 10 MG TABS    Take 10 mg by mouth daily.   FOLIC ACID (FOLVITE) 1 MG TABLET    Take 1 mg by mouth daily.   NON FORMULARY    5 HTP vitamin supplement   PHENAZOPYRIDINE (PYRIDIUM) 200 MG TABLET      Modified Medications   No medications on file  Discontinued Medications   HUMIRA PEN 40 MG/0.8ML PNKT       HYOSCYAMINE (LEVSIN SL) 0.125 MG SL TABLET    Take 1-2 tablets by mouth or under tongue every 4-6 hours as needed     Physical Exam:  Filed Vitals:   09/11/15 1054  BP: 118/68  Pulse: 70  Temp: 98.3 F (36.8 C)  TempSrc: Oral  Resp: 18  Height: 5' 5"  (1.651 m)  Weight: 130 lb 9.6 oz (59.24 kg)  SpO2: 99%   Body mass index is 21.73 kg/(m^2).  Physical Exam  Constitutional: She is oriented to person, place, and time. She appears well-developed and well-nourished. No distress.  HENT:  Head: Normocephalic and atraumatic.  Mouth/Throat: Oropharynx is clear and moist. No oropharyngeal exudate.  Eyes: Conjunctivae are normal. Pupils are equal, round, and reactive to light.  Neck: Normal range of motion. Neck supple.  Cardiovascular: Normal rate, regular rhythm and normal heart sounds.   Pulmonary/Chest: Effort normal and breath sounds normal.  Abdominal: Soft. Bowel sounds are normal.  Musculoskeletal: She exhibits no edema or tenderness.  Neurological: She is alert and oriented to person, place, and time.  Skin: Skin is warm and dry. She is not diaphoretic.  Psychiatric: She has a normal mood and affect.    Labs reviewed: Basic Metabolic Panel: No results for input(s): NA, K, CL, CO2, GLUCOSE, BUN, CREATININE, CALCIUM, MG, PHOS, TSH in the last 8760 hours. Liver Function Tests: No results for input(s):  AST, ALT, ALKPHOS, BILITOT, PROT, ALBUMIN in the last 8760 hours. No results for input(s): LIPASE, AMYLASE in the last 8760 hours. No results for input(s): AMMONIA in the last 8760 hours. CBC: No results for input(s): WBC, NEUTROABS, HGB, HCT, MCV, PLT in the last 8760 hours. Lipid Panel: No results for input(s): CHOL, HDL, LDLCALC, TRIG, CHOLHDL, LDLDIRECT in the last 8760 hours. TSH: No results for input(s): TSH in the last 8760 hours. A1C: No results found for: HGBA1C   Assessment/Plan 1. Wellness examination -conts to follow up with GI, rheumatology and GYN -no new concerns or findings today -encouraged counseling due to situational depression/anxiety -discussed medication but she does not wish start new medication at this time. No SI/HI The patient was counseled regarding the appropriate use of alcohol, regular self-examination of the breasts on a monthly  basis, prevention of dental and periodontal disease, diet, regular sustained exercise for at least 30 minutes 5 times per week, routine screening interval for mammogram as recommended by the [place] and ACOG, importance of regular PAP smears, tobacco use,  and recommended schedule for GI hemoccult testing, colonoscopy, cholesterol, thyroid and diabetes screening. - Lipid panel; Future - Comprehensive metabolic panel; Future - CBC with Differential/Platelets; Future   To follow up in 6 months for routine follow up with Dr Thayer Headings K. Harle Battiest  [facility] [phone] 8 am - 5 pm) [phone] (after hours)

## 2015-09-11 NOTE — Patient Instructions (Signed)
To get blood work fasting later this week or next

## 2015-09-11 NOTE — Addendum Note (Signed)
Addended by: Denyse Amass on: 09/11/2015 01:05 PM   Modules accepted: Orders

## 2015-09-13 ENCOUNTER — Other Ambulatory Visit: Payer: 59

## 2015-09-13 DIAGNOSIS — Z0000 Encounter for general adult medical examination without abnormal findings: Secondary | ICD-10-CM

## 2015-09-14 LAB — COMPREHENSIVE METABOLIC PANEL
ALK PHOS: 55 IU/L (ref 39–117)
ALT: 8 IU/L (ref 0–32)
AST: 13 IU/L (ref 0–40)
Albumin/Globulin Ratio: 1.4 (ref 1.2–2.2)
Albumin: 4.5 g/dL (ref 3.5–5.5)
BUN/Creatinine Ratio: 14 (ref 9–23)
BUN: 11 mg/dL (ref 6–24)
Bilirubin Total: 0.5 mg/dL (ref 0.0–1.2)
CALCIUM: 9.6 mg/dL (ref 8.7–10.2)
CO2: 26 mmol/L (ref 18–29)
Chloride: 100 mmol/L (ref 96–106)
Creatinine, Ser: 0.78 mg/dL (ref 0.57–1.00)
GFR, EST AFRICAN AMERICAN: 110 mL/min/{1.73_m2} (ref >59)
GFR, EST NON AFRICAN AMERICAN: 95 mL/min/{1.73_m2} (ref >59)
GLOBULIN, TOTAL: 3.3 g/dL (ref 1.5–4.5)
Glucose: 96 mg/dL (ref 65–99)
POTASSIUM: 4.1 mmol/L (ref 3.5–5.2)
SODIUM: 142 mmol/L (ref 134–144)
Total Protein: 7.8 g/dL (ref 6.0–8.5)

## 2015-09-14 LAB — CBC WITH DIFFERENTIAL/PLATELET
BASOS: 1 %
Basophils Absolute: 0 10*3/uL (ref 0.0–0.2)
EOS (ABSOLUTE): 0.2 10*3/uL (ref 0.0–0.4)
Eos: 3 %
Hematocrit: 41.1 % (ref 34.0–46.6)
Hemoglobin: 13 g/dL (ref 11.1–15.9)
Immature Grans (Abs): 0 10*3/uL (ref 0.0–0.1)
Immature Granulocytes: 0 %
Lymphocytes Absolute: 1.8 10*3/uL (ref 0.7–3.1)
Lymphs: 28 %
MCH: 26.6 pg (ref 26.6–33.0)
MCHC: 31.6 g/dL (ref 31.5–35.7)
MCV: 84 fL (ref 79–97)
MONOS ABS: 0.7 10*3/uL (ref 0.1–0.9)
Monocytes: 10 %
Neutrophils Absolute: 3.8 10*3/uL (ref 1.4–7.0)
Neutrophils: 58 %
Platelets: 276 10*3/uL (ref 150–379)
RBC: 4.88 x10E6/uL (ref 3.77–5.28)
RDW: 15.5 % — AB (ref 12.3–15.4)
WBC: 6.5 10*3/uL (ref 3.4–10.8)

## 2015-09-14 LAB — LIPID PANEL
CHOL/HDL RATIO: 2.9 ratio (ref 0.0–4.4)
CHOLESTEROL TOTAL: 181 mg/dL (ref 100–199)
HDL: 63 mg/dL (ref >39)
LDL CALC: 95 mg/dL (ref 0–99)
Triglycerides: 114 mg/dL (ref 0–149)
VLDL CHOLESTEROL CAL: 23 mg/dL (ref 5–40)

## 2015-11-14 ENCOUNTER — Ambulatory Visit (INDEPENDENT_AMBULATORY_CARE_PROVIDER_SITE_OTHER): Payer: 59 | Admitting: Gastroenterology

## 2015-11-14 ENCOUNTER — Encounter: Payer: Self-pay | Admitting: Gastroenterology

## 2015-11-14 DIAGNOSIS — K5190 Ulcerative colitis, unspecified, without complications: Secondary | ICD-10-CM

## 2015-11-14 NOTE — Progress Notes (Signed)
Review of pertinent gastrointestinal problems: 1. Extensive ulcerative colitis: Was pregnant, diagnosed with UC in 2003. First med with asocol and rowasa. Then off meds for 8 years without any symptoms (no diarrhea). Started to flare in 2012 (bloody diarrhea), was on steroids for about a year, restarted asacol. Reactive arthritis (migratory large joints). She started on humira 2012 by rheumtologist. Everything improved (joints and GI symptoms).Colonoscopy January 2014, [place], described to have a normal cecum and ileocecal valve, there was minimal erythema throughout the colon, random biopsies were taken. Biopsies showed active ulcerative colitis without dysplasia. Colonoscopy September 2012 in [place], described as having a normal terminal ileum. TI was biopsied to rule out inflammation. There was mild erythema and granularity throughout the entire colon. Random biopsies were taken. Biopsies showed mild chronic ileitis and active colitis. Colonoscopy in 2003 and 2005. These were both in [place]. I only have hand written reports and they are completely illegible. A biopsy from the 2003 colonoscopy does show chronic, active colitis.   Established care Dr. Ardis Hughs 2016, no change in meds, continue humira every other week.   HPI: This is a very pleasant 40 year old woman whom I last saw 9 months ago.   Chief complaint; extensive colitis  She has been on Humira every other week in her GI disease is under good control. She has no bleeding. She has 1-2 soft formed bowel movements daily. She has only very mild intermittent abdominal pains and antispasmodic medicines have seemed to help.  ROS: complete GI ROS as described in HPI.  Constitutional:  No unintentional weight loss   Past Medical History:  Diagnosis Date  . Arthritis 2012   reactive arthritis  . History of kidney disease as a child   . Sjogren's syndrome ([place])   . Ulcerative colitis [facility])     Past Surgical History:   Procedure Laterality Date  . TUBAL LIGATION      Current Outpatient Prescriptions  Medication Sig Dispense Refill  . acetaminophen (TYLENOL) 500 MG tablet Take 1,000 mg by mouth every 12 (twelve) hours as needed.    . Adalimumab (HUMIRA) 40 MG/0.8ML PSKT Inject 40 mg into the skin every 14 (fourteen) days.    . Biotin (BIOTIN MAXIMUM STRENGTH) 10 MG TABS Take 10 mg by mouth daily.    . folic acid (FOLVITE) 1 MG tablet Take 1 mg by mouth daily.    . NON FORMULARY 5 HTP vitamin supplement    . phenazopyridine (PYRIDIUM) 200 MG tablet      No current facility-administered medications for this visit.     Allergies as of 11/14/2015 - Review Complete 09/11/2015  Allergen Reaction Noted  . Iodine  02/16/2014    Family History  Problem Relation Age of Onset  . Cancer Mother 32    Breast  . Hypertension Mother   . Cancer Father 24    bone  . Ulcerative colitis Brother 56  . Diabetes Brother   . Diabetes Maternal Grandmother     Social History   Social History  . Marital status: Married    Spouse name: N/A  . Number of children: N/A  . Years of education: N/A   Occupational History  . Not on file.   Social History Main Topics  . Smoking status: Never Smoker  . Smokeless tobacco: Never Used  . Alcohol use No     Comment: occ glass of wine  . Drug use: No  . Sexual activity: Yes    Birth control/ protection:  Surgical   Other Topics Concern  . Not on file   Social History Narrative  . No narrative on file     Physical Exam: Weight 132.8 pounds, blood pressure 128/80, pulse 76  Constitutional: generally well-appearing Psychiatric: alert and oriented x3 Abdomen: soft, nontender, nondistended, no obvious ascites, no peritoneal signs, normal bowel sounds No peripheral edema noted in lower extremities  Assessment and plan: 40 y.o. female wiextensive ulcerative colitis  her colitis is under good control on Humira every other week. She will continue this regimen  for now. She will return to see me in 6 months and sooner if needed. At that point we will likely schedule her for high risk screening colonoscopy.   Owens Loffler, MD [facility] 11/14/2015, 9:25 AM

## 2015-11-14 NOTE — Patient Instructions (Signed)
Follow up with Dr Ardis Hughs in 6 months.  We will send a letter to have you call and make that appointment.

## 2015-11-14 NOTE — Progress Notes (Signed)
Follow up with Dr Ardis Hughs in 6 months.  We will send a letter to have you call and make that appointment.

## 2016-01-01 ENCOUNTER — Other Ambulatory Visit: Payer: Self-pay | Admitting: Obstetrics & Gynecology

## 2016-01-01 DIAGNOSIS — Z1231 Encounter for screening mammogram for malignant neoplasm of breast: Secondary | ICD-10-CM

## 2016-01-09 ENCOUNTER — Ambulatory Visit
Admission: RE | Admit: 2016-01-09 | Discharge: 2016-01-09 | Disposition: A | Discharge status: Home / Self Care | Payer: 59 | Source: Ambulatory Visit | Attending: Obstetrics & Gynecology | Admitting: Obstetrics & Gynecology

## 2016-01-09 DIAGNOSIS — Z1231 Encounter for screening mammogram for malignant neoplasm of breast: Secondary | ICD-10-CM

## 2016-03-15 ENCOUNTER — Ambulatory Visit: Payer: 59 | Admitting: Internal Medicine

## 2016-03-29 ENCOUNTER — Ambulatory Visit
Admission: RE | Admit: 2016-03-29 | Discharge: 2016-03-29 | Disposition: A | Discharge status: Home / Self Care | Payer: 59 | Source: Ambulatory Visit | Attending: Internal Medicine | Admitting: Internal Medicine

## 2016-03-29 ENCOUNTER — Ambulatory Visit (INDEPENDENT_AMBULATORY_CARE_PROVIDER_SITE_OTHER): Payer: 59 | Admitting: Internal Medicine

## 2016-03-29 ENCOUNTER — Encounter: Payer: Self-pay | Admitting: Internal Medicine

## 2016-03-29 VITALS — BP 118/76 | HR 77 | Temp 98.0°F | Ht 65.0 in | Wt 131.6 lb

## 2016-03-29 DIAGNOSIS — K5190 Ulcerative colitis, unspecified, without complications: Secondary | ICD-10-CM | POA: Diagnosis not present

## 2016-03-29 DIAGNOSIS — F411 Generalized anxiety disorder: Secondary | ICD-10-CM

## 2016-03-29 DIAGNOSIS — M0230 Reiter's disease, unspecified site: Secondary | ICD-10-CM

## 2016-03-29 DIAGNOSIS — R0781 Pleurodynia: Secondary | ICD-10-CM

## 2016-03-29 DIAGNOSIS — M7072 Other bursitis of hip, left hip: Secondary | ICD-10-CM | POA: Diagnosis not present

## 2016-03-29 DIAGNOSIS — M3500 Sicca syndrome, unspecified: Secondary | ICD-10-CM

## 2016-03-29 MED ORDER — HYOSCYAMINE SULFATE SL 0.125 MG SL SUBL: SUBLINGUAL_TABLET | SUBLINGUAL | 3 refills | Status: DC

## 2016-03-29 NOTE — Progress Notes (Signed)
Patient ID: Laura Finley, female   DOB: November 23, 1975, 41 y.o.   MRN: [id number]    Location:  [facility] Place of Service: OFFICE  Chief Complaint  Patient presents with  . Medical Management of Chronic Issues    6 months visit for Med Management    HPI:  41 yo female seen today for f/u. She has no concerns today. Her situational anxiety is intermittent. She relocated from [place] in June 2015. 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  She has burning sensation in abdomen on left that radiates into left ribs  sjogren's syndrome - she has reactive arthritis. Last left hip steroid injection was in March 2017. She noticed concavity at injection site. Followed by rheumatology Dr Dossie Der  UC - stable on humira. She takes MVI and folate. No rectal bleeding. Followed by GI Dr Ardis Hughs. Last colonoscopy was in [place] in 2014. Last flare in 2012 but lasted 2 yrs. Per GI notes, will get screening colonoscopy in 2018. She has reactive arthritis that started after last UC flare.   Past Medical History:  Diagnosis Date  . Arthritis 2012   reactive arthritis  . History of kidney disease as a child   . Sjogren's syndrome ([place])   . Ulcerative colitis [facility])     Past Surgical History:  Procedure Laterality Date  . TUBAL LIGATION      Patient Care Team: Lauree Chandler, NP as PCP - General (Nurse Practitioner)  Social History   Social History  . Marital status: Married    Spouse name: N/A  . Number of children: N/A  . Years of education: N/A   Occupational History  . Not on file.   Social History Main Topics  . Smoking status: Never Smoker  . Smokeless tobacco: Never Used  . Alcohol use No     Comment: occ glass of wine  . Drug use: No  . Sexual activity: Yes    Birth control/ protection: Surgical   Other Topics Concern  . Not on file   Social History Narrative  . No narrative on file     reports that she has never  smoked. She has never used smokeless tobacco. She reports that she does not drink alcohol or use drugs.  Family History  Problem Relation Age of Onset  . Cancer Mother 89    Breast  . Hypertension Mother   . Cancer Father 63    bone  . Ulcerative colitis Brother 50  . Diabetes Brother   . Diabetes Maternal Grandmother    Family Status  Relation Status  . Mother Alive  . Father Deceased  . Brother Alive  . Sister Alive  . Brother Alive  . Son Alive  . Daughter Alive  . Maternal Grandmother      Allergies  Allergen Reactions  . Iodine     Medications: Patient's Medications  New Prescriptions   No medications on file  Previous Medications   ACETAMINOPHEN (TYLENOL) 500 MG TABLET    Take 1,000 mg by mouth every 12 (twelve) hours as needed.   ADALIMUMAB (HUMIRA) 40 MG/0.8ML PSKT    Inject 40 mg into the skin every 14 (fourteen) days.   BIOTIN (BIOTIN MAXIMUM STRENGTH) 10 MG TABS    Take 10 mg by mouth daily.   CHOLECALCIFEROL (VITAMIN D PO)    Take 1 tablet by mouth daily.   CYANOCOBALAMIN (VITAMIN B-12 PO)  Take 1 tablet by mouth daily.   FOLIC ACID (FOLVITE) 1 MG TABLET    Take 1 mg by mouth daily.   NON FORMULARY    5 HTP vitamin supplement   PHENAZOPYRIDINE (PYRIDIUM) 200 MG TABLET      Modified Medications   No medications on file  Discontinued Medications   No medications on file    Review of Systems  Gastrointestinal: Positive for abdominal pain.  Musculoskeletal: Positive for arthralgias.  Psychiatric/Behavioral: The patient is nervous/anxious.   All other systems reviewed and are negative.   Vitals:   03/29/16 0839  BP: 118/76  Pulse: 77  Temp: 98 F (36.7 C)  TempSrc: Oral  SpO2: 99%  Weight: 131 lb 9.6 oz (59.7 kg)  Height: 5' 5"  (1.651 m)   Body mass index is 21.9 kg/m.  Physical Exam  Constitutional: She is oriented to person, place, and time. She appears well-developed and well-nourished.  HENT:  Mouth/Throat: Oropharynx is clear and  moist. No oropharyngeal exudate.  Eyes: Pupils are equal, round, and reactive to light. No scleral icterus.  Neck: Neck supple. Carotid bruit is not present. No tracheal deviation present. No thyromegaly present.  Cardiovascular: Normal rate, regular rhythm, normal heart sounds and intact distal pulses.  Exam reveals no gallop and no friction rub.   No murmur heard. No LE edema b/l. no calf TTP.   Pulmonary/Chest: Effort normal and breath sounds normal. No stridor. No respiratory distress. She has no wheezes. She has no rhonchi. She has no rales. She exhibits tenderness (left lateral CW). She exhibits no mass, no bony tenderness, no crepitus, no edema, no deformity and no swelling.    Abdominal: Soft. Bowel sounds are normal. She exhibits no distension and no mass. There is no hepatomegaly. There is tenderness (generally). There is no rebound and no guarding.  Musculoskeletal: She exhibits edema and tenderness. She exhibits no deformity.       Left hip: She exhibits decreased range of motion, tenderness and swelling.       Legs: Left SI joint restriction; rib 8 stuck on left with TTP  Lymphadenopathy:    She has no cervical adenopathy.  Neurological: She is alert and oriented to person, place, and time.  Skin: Skin is warm and dry. No rash noted.  Psychiatric: She has a normal mood and affect. Her behavior is normal. Judgment and thought content normal.     Labs reviewed: No visits with results within 3 Month(s) from this visit.  Latest known visit with results is:  Appointment on 09/13/2015  Component Date Value Ref Range Status  . Cholesterol, Total 09/14/2015 181  100 - 199 mg/dL Final  . Triglycerides 09/14/2015 114  0 - 149 mg/dL Final  . HDL 09/14/2015 63  >39 mg/dL Final  . VLDL Cholesterol Cal 09/14/2015 23  5 - 40 mg/dL Final  . LDL Calculated 09/14/2015 95  0 - 99 mg/dL Final  . Chol/HDL Ratio 09/14/2015 2.9  0.0 - 4.4 ratio units Final   Comment:                                    T. Chol/HDL Ratio  Men  Women                               1/2 Avg.Risk  3.4    3.3                                   Avg.Risk  5.0    4.4                                2X Avg.Risk  9.6    7.1                                3X Avg.Risk 23.4   11.0   . Glucose 09/14/2015 96  65 - 99 mg/dL Final  . BUN 09/14/2015 11  6 - 24 mg/dL Final  . Creatinine, Ser 09/14/2015 0.78  0.57 - 1.00 mg/dL Final  . GFR calc non Af Amer 09/14/2015 95  >59 mL/min/1.73 Final  . GFR calc Af Amer 09/14/2015 110  >59 mL/min/1.73 Final  . BUN/Creatinine Ratio 09/14/2015 14  9 - 23 Final  . Sodium 09/14/2015 142  134 - 144 mmol/L Final  . Potassium 09/14/2015 4.1  3.5 - 5.2 mmol/L Final  . Chloride 09/14/2015 100  96 - 106 mmol/L Final  . CO2 09/14/2015 26  18 - 29 mmol/L Final  . Calcium 09/14/2015 9.6  8.7 - 10.2 mg/dL Final  . Total Protein 09/14/2015 7.8  6.0 - 8.5 g/dL Final  . Albumin 09/14/2015 4.5  3.5 - 5.5 g/dL Final  . Globulin, Total 09/14/2015 3.3  1.5 - 4.5 g/dL Final  . Albumin/Globulin Ratio 09/14/2015 1.4  1.2 - 2.2 Final  . Bilirubin Total 09/14/2015 0.5  0.0 - 1.2 mg/dL Final  . Alkaline Phosphatase 09/14/2015 55  39 - 117 IU/L Final  . AST 09/14/2015 13  0 - 40 IU/L Final  . ALT 09/14/2015 8  0 - 32 IU/L Final  . WBC 09/14/2015 6.5  3.4 - 10.8 x10E3/uL Final  . RBC 09/14/2015 4.88  3.77 - 5.28 x10E6/uL Final  . Hemoglobin 09/14/2015 13.0  11.1 - 15.9 g/dL Final  . Hematocrit 09/14/2015 41.1  34.0 - 46.6 % Final  . MCV 09/14/2015 84  79 - 97 fL Final  . MCH 09/14/2015 26.6  26.6 - 33.0 pg Final  . MCHC 09/14/2015 31.6  31.5 - 35.7 g/dL Final  . RDW 09/14/2015 15.5* 12.3 - 15.4 % Final  . Platelets 09/14/2015 276  150 - 379 x10E3/uL Final  . Neutrophils 09/14/2015 58  % Final  . Lymphs 09/14/2015 28  % Final  . Monocytes 09/14/2015 10  % Final  . Eos 09/14/2015 3  % Final  . Basos 09/14/2015 1  % Final  . Neutrophils Absolute  09/14/2015 3.8  1.4 - 7.0 x10E3/uL Final  . Lymphocytes Absolute 09/14/2015 1.8  0.7 - 3.1 x10E3/uL Final  . Monocytes Absolute 09/14/2015 0.7  0.1 - 0.9 x10E3/uL Final  . EOS (ABSOLUTE) 09/14/2015 0.2  0.0 - 0.4 x10E3/uL Final  . Basophils Absolute 09/14/2015 0.0  0.0 - 0.2 x10E3/uL Final  . Immature Granulocytes 09/14/2015 0  % Final  . Immature Grans (Abs) 09/14/2015 0.0  0.0 - 0.1 x10E3/uL Final  No results found.   Assessment/Plan   ICD-9-CM ICD-10-CM   1. Rib pain on left side 786.50 R07.81 DG Ribs Unilateral Left  2. Reactive arthritis (HCC) 099.3 M02.30    711.10    3. Anxiety state 300.00 F41.1   4. Ulcerative colitis without complications, unspecified location (HCC) 556.9 K51.90 Hyoscyamine Sulfate SL (LEVSIN/SL) 0.125 MG SUBL  5. Sjogren's syndrome, with unspecified organ involvement ([place]) 710.2 M35.00   6. Bursitis of left hip, unspecified bursa 726.5 M70.72    Resume levsin for abdomen as needed  Continue other medications as ordered  Follow up with specialists as scheduled  Will call with xray results  May use warm compress as needed to left hip and rib area for pain  Follow up in 6 mos for CPE with Gaspar Cola S. Perlie Gold  [facility] [address] [place], [place] [address] [phone] Cell (Monday-Friday 8 AM - 5 PM) [phone] After 5 PM and follow prompts

## 2016-03-29 NOTE — Patient Instructions (Signed)
Resume levsin for abdomen as needed  Continue other medications as ordered  Follow up with specialists as scheduled  Will call with xray results  May use warm compress as needed to left hip and rib area for pain  Follow up in 6 mos for CPE with Janett Billow

## 2016-04-11 DIAGNOSIS — Z79899 Other long term (current) drug therapy: Secondary | ICD-10-CM | POA: Diagnosis not present

## 2016-05-21 DIAGNOSIS — Z01419 Encounter for gynecological examination (general) (routine) without abnormal findings: Secondary | ICD-10-CM | POA: Diagnosis not present

## 2016-05-21 DIAGNOSIS — Z6822 Body mass index (BMI) 22.0-22.9, adult: Secondary | ICD-10-CM | POA: Diagnosis not present

## 2016-09-26 ENCOUNTER — Encounter: Payer: Self-pay | Admitting: Nurse Practitioner

## 2016-09-26 ENCOUNTER — Ambulatory Visit (INDEPENDENT_AMBULATORY_CARE_PROVIDER_SITE_OTHER): Payer: 59 | Admitting: Nurse Practitioner

## 2016-09-26 VITALS — BP 112/72 | HR 79 | Temp 98.1°F | Resp 17 | Ht 65.0 in | Wt 134.4 lb

## 2016-09-26 DIAGNOSIS — F4321 Adjustment disorder with depressed mood: Secondary | ICD-10-CM

## 2016-09-26 DIAGNOSIS — F5104 Psychophysiologic insomnia: Secondary | ICD-10-CM

## 2016-09-26 DIAGNOSIS — F4320 Adjustment disorder, unspecified: Secondary | ICD-10-CM

## 2016-09-26 DIAGNOSIS — Z0000 Encounter for general adult medical examination without abnormal findings: Secondary | ICD-10-CM | POA: Diagnosis not present

## 2016-09-26 NOTE — Patient Instructions (Signed)
Follow up for fasting lab work when you are available   Cont Melatonin for sleep

## 2016-09-26 NOTE — Progress Notes (Signed)
Careteam: Patient Care Team: Lauree Chandler, NP as PCP - General (Nurse Practitioner)  Advanced Directive information Does Patient Have a Medical Advance Directive?: No  Allergies  Allergen Reactions  . Iodine     Chief Complaint  Patient presents with  . Medical Management of Chronic Issues    Pt is being seen for a complete physical and follow up on chronic conditions.      HPI: Patient is a 41 y.o. female seen in the office today for wellness exam Reports her mother died since last visit. This has been hard on her but working on ways to cope. She loves to paint and finds a purpose in that. She has 2 children; oldest went to college last year and is home for the summer. conts to follow up with rheumatology for RA and GYN.  Screenings: Breast Cancer- following with GYN, due to family hx  (mother recently died of breast cancer) getting every 6 months.  Cervical Cancer- following with GYN, last pelvic exam March 2018 Depression screening Depression screen [facility] 2/9 09/26/2016 09/11/2015 03/03/2014  Decreased Interest 0 0 0  Down, Depressed, Hopeless 0 0 0  PHQ - 2 Score 0 0 0   Smoking status:.none Alcohol use: none  Dentist: every 6 months Ophthalmologist: yearly or more often  Exercise regimen: routinely exercises, now walking and toning (was doing crossfit and rheumatologist told her NOT to do that short of exercise)  Diet: tries to eat healthy  Review of Systems:  Review of Systems  Constitutional: Negative for chills, fever and weight loss.  HENT: Negative for tinnitus.   Respiratory: Negative for cough, sputum production and shortness of breath.   Cardiovascular: Negative for chest pain, palpitations and leg swelling.  Gastrointestinal: Negative for abdominal pain, constipation, diarrhea and heartburn.  Genitourinary: Negative for dysuria, frequency and urgency.  Musculoskeletal: Positive for joint pain (RA). Negative for back pain, falls and myalgias.    Skin: Negative.   Neurological: Negative for dizziness and headaches.  Psychiatric/Behavioral: Negative for depression and memory loss. The patient has insomnia.     Past Medical History:  Diagnosis Date  . Arthritis 2012   reactive arthritis  . History of kidney disease as a child   . Sjogren's syndrome ([place])   . Ulcerative colitis [facility])    Past Surgical History:  Procedure Laterality Date  . TUBAL LIGATION     Social History:   reports that she has never smoked. She has never used smokeless tobacco. She reports that she does not drink alcohol or use drugs.  Family History  Problem Relation Age of Onset  . Cancer Mother 55       Breast  . Hypertension Mother   . Cancer Father 72       bone  . Ulcerative colitis Brother 31  . Diabetes Brother   . Diabetes Maternal Grandmother     Medications: Patient's Medications  New Prescriptions   No medications on file  Previous Medications   ACETAMINOPHEN (TYLENOL) 500 MG TABLET    Take 1,000 mg by mouth every 12 (twelve) hours as needed.   ADALIMUMAB (HUMIRA) 40 MG/0.8ML PSKT    Inject 40 mg into the skin every 14 (fourteen) days.   BIOTIN (BIOTIN MAXIMUM STRENGTH) 10 MG TABS    Take 10 mg by mouth daily.   CETIRIZINE (ZYRTEC) 10 MG TABLET    Take 10 mg by mouth daily.   CHOLECALCIFEROL (VITAMIN D PO)    Take 1 tablet  by mouth daily.   CYANOCOBALAMIN (VITAMIN B-12 PO)    Take 1 tablet by mouth daily.   FOLIC ACID (FOLVITE) 1 MG TABLET    Take 1 mg by mouth daily.   HYOSCYAMINE SULFATE SL (LEVSIN/SL) 0.125 MG SUBL    Take 1-2 tabs po every 4-6 hours as needed   NON FORMULARY    5 HTP vitamin supplement  Modified Medications   No medications on file  Discontinued Medications   PHENAZOPYRIDINE (PYRIDIUM) 200 MG TABLET         Physical Exam:  Vitals:   09/26/16 1100  BP: 112/72  Pulse: 79  Resp: 17  Temp: 98.1 F (36.7 C)  TempSrc: Oral  SpO2: 97%  Weight: 134 lb 6.4 oz (61 kg)  Height: 5' 5"  (1.651 m)   Body  mass index is 22.37 kg/m.  Physical Exam  Constitutional: She is oriented to person, place, and time. She appears well-developed and well-nourished. No distress.  HENT:  Head: Normocephalic and atraumatic.  Mouth/Throat: Oropharynx is clear and moist. No oropharyngeal exudate.  Eyes: Pupils are equal, round, and reactive to light. Conjunctivae are normal.  Neck: Normal range of motion. Neck supple.  Cardiovascular: Normal rate, regular rhythm and normal heart sounds.   Pulmonary/Chest: Effort normal and breath sounds normal.  BREAST EXAM BY GYN  Abdominal: Soft. Bowel sounds are normal.  Genitourinary:  Genitourinary Comments: By GYN  Musculoskeletal: She exhibits no edema or tenderness.  Neurological: She is alert and oriented to person, place, and time.  Skin: Skin is warm and dry. She is not diaphoretic.  Psychiatric: She has a normal mood and affect.    Labs reviewed: Basic Metabolic Panel: No results for input(s): NA, K, CL, CO2, GLUCOSE, BUN, CREATININE, CALCIUM, MG, PHOS, TSH in the last 8760 hours. Liver Function Tests: No results for input(s): AST, ALT, ALKPHOS, BILITOT, PROT, ALBUMIN in the last 8760 hours. No results for input(s): LIPASE, AMYLASE in the last 8760 hours. No results for input(s): AMMONIA in the last 8760 hours. CBC: No results for input(s): WBC, NEUTROABS, HGB, HCT, MCV, PLT in the last 8760 hours. Lipid Panel: No results for input(s): CHOL, HDL, LDLCALC, TRIG, CHOLHDL, LDLDIRECT in the last 8760 hours. TSH: No results for input(s): TSH in the last 8760 hours. A1C: No results found for: HGBA1C   Assessment/Plan 1. Wellness examination -The patient was counseled regarding the appropriate use of alcohol, regular self-examination of the breasts on a monthly basis, prevention of dental and periodontal disease, diet, regular sustained exercise for at least 30 minutes 5 times per week, routine screening interval for mammogram as recommended by the [place] and ACOG, importance of regular PAP smears, tobacco use,  and recommended schedule for GI hemoccult testing, colonoscopy, cholesterol, thyroid and diabetes screening. - Lipid Panel; Future - CMP with eGFR; Future - CBC with Differential/Platelets; Future  2. Grief Over the lost of her mother, feels like he is grieving appropriately, no overwhelming anxiety or depression. Looking into counseling provided by her husbands work.   3. Psychophysiological insomnia Using melatonin as needed. To cont this. Education provided for proper sleep.    Carlos American. Harle Battiest  [facility] Adult Medicine [phone] 8 am - 5 pm) [phone] (after hours)

## 2016-10-01 ENCOUNTER — Other Ambulatory Visit: Payer: 59

## 2016-10-01 DIAGNOSIS — Z0000 Encounter for general adult medical examination without abnormal findings: Secondary | ICD-10-CM | POA: Diagnosis not present

## 2016-10-01 LAB — CBC WITH DIFFERENTIAL/PLATELET
BASOS ABS: 43 {cells}/uL (ref 0–200)
Basophils Relative: 1 %
EOS PCT: 4 %
Eosinophils Absolute: 172 cells/uL (ref 15–500)
HCT: 39.9 % (ref 35.0–45.0)
Hemoglobin: 12.7 g/dL (ref 11.7–15.5)
Lymphocytes Relative: 31 %
Lymphs Abs: 1333 cells/uL (ref 850–3900)
MCH: 27.3 pg (ref 27.0–33.0)
MCHC: 31.8 g/dL — AB (ref 32.0–36.0)
MCV: 85.6 fL (ref 80.0–100.0)
MONOS PCT: 8 %
MPV: 10.5 fL (ref 7.5–12.5)
Monocytes Absolute: 344 cells/uL (ref 200–950)
NEUTROS ABS: 2408 {cells}/uL (ref 1500–7800)
Neutrophils Relative %: 56 %
PLATELETS: 234 10*3/uL (ref 140–400)
RBC: 4.66 MIL/uL (ref 3.80–5.10)
RDW: 14.3 % (ref 11.0–15.0)
WBC: 4.3 10*3/uL (ref 3.8–10.8)

## 2016-10-01 LAB — COMPLETE METABOLIC PANEL WITH GFR
ALBUMIN: 4.1 g/dL (ref 3.6–5.1)
ALK PHOS: 49 U/L (ref 33–115)
ALT: 13 U/L (ref 6–29)
AST: 15 U/L (ref 10–30)
BILIRUBIN TOTAL: 0.5 mg/dL (ref 0.2–1.2)
BUN: 10 mg/dL (ref 7–25)
CALCIUM: 9.4 mg/dL (ref 8.6–10.2)
CO2: 24 mmol/L (ref 20–31)
Chloride: 105 mmol/L (ref 98–110)
Creat: 0.73 mg/dL (ref 0.50–1.10)
GFR, Est African American: >89 mL/min (ref >=60)
GFR, Est Non African American: >89 mL/min (ref >=60)
GLUCOSE: 92 mg/dL (ref 65–99)
Potassium: 4.2 mmol/L (ref 3.5–5.3)
Sodium: 138 mmol/L (ref 135–146)
TOTAL PROTEIN: 7.2 g/dL (ref 6.1–8.1)

## 2016-10-01 LAB — LIPID PANEL
CHOLESTEROL: 181 mg/dL (ref <200)
HDL: 53 mg/dL (ref >50)
LDL Cholesterol: 108 mg/dL — ABNORMAL HIGH (ref <100)
TRIGLYCERIDES: 102 mg/dL (ref <150)
Total CHOL/HDL Ratio: 3.4 Ratio (ref <5.0)
VLDL: 20 mg/dL (ref <30)

## 2016-10-03 ENCOUNTER — Encounter: Payer: Self-pay | Admitting: *Deleted

## 2016-10-09 DIAGNOSIS — R768 Other specified abnormal immunological findings in serum: Secondary | ICD-10-CM | POA: Diagnosis not present

## 2016-10-09 DIAGNOSIS — M4690 Unspecified inflammatory spondylopathy, site unspecified: Secondary | ICD-10-CM | POA: Diagnosis not present

## 2016-10-09 DIAGNOSIS — K51919 Ulcerative colitis, unspecified with unspecified complications: Secondary | ICD-10-CM | POA: Diagnosis not present

## 2016-12-24 ENCOUNTER — Other Ambulatory Visit: Payer: Self-pay | Admitting: Obstetrics & Gynecology

## 2016-12-24 DIAGNOSIS — Z0000 Encounter for general adult medical examination without abnormal findings: Secondary | ICD-10-CM

## 2017-01-10 ENCOUNTER — Ambulatory Visit
Admission: RE | Admit: 2017-01-10 | Discharge: 2017-01-10 | Disposition: A | Discharge status: Home / Self Care | Payer: 59 | Source: Ambulatory Visit | Attending: Obstetrics & Gynecology | Admitting: Obstetrics & Gynecology

## 2017-01-10 DIAGNOSIS — Z0000 Encounter for general adult medical examination without abnormal findings: Secondary | ICD-10-CM

## 2017-01-10 DIAGNOSIS — Z1231 Encounter for screening mammogram for malignant neoplasm of breast: Secondary | ICD-10-CM | POA: Diagnosis not present

## 2017-04-04 ENCOUNTER — Ambulatory Visit: Payer: 59 | Admitting: Nurse Practitioner

## 2017-04-04 ENCOUNTER — Encounter: Payer: Self-pay | Admitting: Nurse Practitioner

## 2017-04-04 VITALS — BP 120/80 | HR 68 | Temp 98.4°F | Wt 130.0 lb

## 2017-04-04 DIAGNOSIS — Z0289 Encounter for other administrative examinations: Secondary | ICD-10-CM

## 2017-04-04 DIAGNOSIS — Z111 Encounter for screening for respiratory tuberculosis: Secondary | ICD-10-CM | POA: Diagnosis not present

## 2017-04-04 DIAGNOSIS — M0230 Reiter's disease, unspecified site: Secondary | ICD-10-CM

## 2017-04-04 DIAGNOSIS — K5190 Ulcerative colitis, unspecified, without complications: Secondary | ICD-10-CM

## 2017-04-04 NOTE — Patient Instructions (Signed)
To follow up in office 48-72 hours to read PPD

## 2017-04-04 NOTE — Progress Notes (Signed)
Careteam: Patient Care Team: Lauree Chandler, NP as PCP - General (Nurse Practitioner)   Allergies  Allergen Reactions  . Iodine     Chief Complaint  Patient presents with  . Follow-up    complete form     HPI: Patient is a 42 y.o. female seen in the office today for a new job. Has been doing a lot of home projects after her mother died but ready  to get a job outside of her home. 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  Weather makes pain worse but manageable. Using Humira for reactive arthritis originally but helps with ulcerative colitis.  Also using Voltaren gel   Review of Systems:  Review of Systems  Constitutional: Negative for chills, fever and weight loss.  HENT: Negative for tinnitus.   Respiratory: Negative for cough, sputum production and shortness of breath.   Cardiovascular: Negative for chest pain, palpitations and leg swelling.  Gastrointestinal: Negative for abdominal pain, constipation, diarrhea and heartburn.  Genitourinary: Negative for dysuria, frequency and urgency.  Musculoskeletal: Positive for joint pain (RA). Negative for back pain, falls and myalgias.  Skin: Negative.   Neurological: Negative for dizziness and headaches.  Psychiatric/Behavioral: Negative for depression and memory loss. The patient does not have insomnia.     Past Medical History:  Diagnosis Date  . Arthritis 2012   reactive arthritis  . History of kidney disease as a child   . Sjogren's syndrome ([place])   . Ulcerative colitis [facility])    Past Surgical History:  Procedure Laterality Date  . TUBAL LIGATION     Social History:   reports that  has never smoked. she has never used smokeless tobacco. She reports that she does not drink alcohol or use drugs.  Family History  Problem Relation Age of Onset  . Cancer Mother 55       Breast  . Hypertension Mother   . Cancer Father 56       bone  . Ulcerative colitis  Brother 55  . Diabetes Brother   . Diabetes Maternal Grandmother     Medications: Patient's Medications  New Prescriptions   No medications on file  Previous Medications   ACETAMINOPHEN (TYLENOL) 500 MG TABLET    Take 1,000 mg by mouth every 12 (twelve) hours as needed.   ADALIMUMAB (HUMIRA) 40 MG/0.8ML PSKT    Inject 40 mg into the skin every 14 (fourteen) days.   BIOTIN (BIOTIN MAXIMUM STRENGTH) 10 MG TABS    Take 10 mg by mouth daily.   CETIRIZINE (ZYRTEC) 10 MG TABLET    Take 10 mg by mouth daily.   CHOLECALCIFEROL (VITAMIN D PO)    Take 1 tablet by mouth daily.   CYANOCOBALAMIN (VITAMIN B-12 PO)    Take 1 tablet by mouth daily.   FOLIC ACID (FOLVITE) 1 MG TABLET    Take 1 mg by mouth daily.   NON FORMULARY    5 HTP vitamin supplement  Modified Medications   No medications on file  Discontinued Medications   HYOSCYAMINE SULFATE SL (LEVSIN/SL) 0.125 MG SUBL    Take 1-2 tabs po every 4-6 hours as needed     Physical Exam:  Vitals:   04/04/17 1017  BP: 120/80  Pulse: 68  Temp: 98.4 F (36.9 C)  TempSrc: Oral  SpO2: 99%  Weight: 130 lb (59 kg)   Body mass index is 21.63 kg/m.  Physical Exam  Constitutional: She is oriented to person, place, and time. She appears well-developed and well-nourished. No distress.  HENT:  Head: Normocephalic and atraumatic.  Mouth/Throat: Oropharynx is clear and moist.  Eyes: Conjunctivae are normal. Pupils are equal, round, and reactive to light.  Neck: Normal range of motion.  Cardiovascular: Normal rate, regular rhythm and normal heart sounds.  Abdominal: Soft. Bowel sounds are normal.  Neurological: She is alert and oriented to person, place, and time.  Skin: Skin is warm and dry. She is not diaphoretic.  Psychiatric: She has a normal mood and affect.    Labs reviewed: Basic Metabolic Panel: Recent Labs    10/01/16 0923  NA 138  K 4.2  CL 105  CO2 24  GLUCOSE 92  BUN 10  CREATININE 0.73  CALCIUM 9.4   Liver Function  Tests: Recent Labs    10/01/16 0923  AST 15  ALT 13  ALKPHOS 49  BILITOT 0.5  PROT 7.2  ALBUMIN 4.1   No results for input(s): LIPASE, AMYLASE in the last 8760 hours. No results for input(s): AMMONIA in the last 8760 hours. CBC: Recent Labs    10/01/16 0923  WBC 4.3  NEUTROABS 2,408  HGB 12.7  HCT 39.9  MCV 85.6  PLT 234   Lipid Panel: Recent Labs    10/01/16 0923  CHOL 181  HDL 53  LDLCALC 108*  TRIG 102  CHOLHDL 3.4   TSH: No results for input(s): TSH in the last 8760 hours. A1C: No results found for: HGBA1C   Assessment/Plan 1. Reactive arthritis (HCC) -stable  2. Ulcerative colitis without complications, unspecified location ([place]) Stable at this time.   3. PPD screening test - TB Skin Test  4. Encounter for completion of form with patient -form completion complete  Will follow up Tb in 72 hours.    Carlos American. Harle Battiest  [facility] Adult Medicine [phone] 8 am - 5 pm) [phone] (after hours)

## 2017-04-07 ENCOUNTER — Other Ambulatory Visit (INDEPENDENT_AMBULATORY_CARE_PROVIDER_SITE_OTHER): Payer: 59 | Admitting: Nurse Practitioner

## 2017-04-07 VITALS — HR 68 | Temp 98.2°F

## 2017-04-07 DIAGNOSIS — Z111 Encounter for screening for respiratory tuberculosis: Secondary | ICD-10-CM

## 2017-04-07 LAB — TB SKIN TEST
Induration: 0 mm
TB Skin Test: NEGATIVE

## 2017-04-07 NOTE — Progress Notes (Signed)
PPD Reading Note PPD read and results entered in [place]. Result: 0 mm induration. Interpretation: negative  Test read by Coralyn Mark, CMA

## 2017-04-11 DIAGNOSIS — M4690 Unspecified inflammatory spondylopathy, site unspecified: Secondary | ICD-10-CM | POA: Diagnosis not present

## 2017-04-11 DIAGNOSIS — R768 Other specified abnormal immunological findings in serum: Secondary | ICD-10-CM | POA: Diagnosis not present

## 2017-04-11 DIAGNOSIS — K51919 Ulcerative colitis, unspecified with unspecified complications: Secondary | ICD-10-CM | POA: Diagnosis not present

## 2017-05-09 DIAGNOSIS — K51919 Ulcerative colitis, unspecified with unspecified complications: Secondary | ICD-10-CM | POA: Diagnosis not present

## 2017-05-09 DIAGNOSIS — R768 Other specified abnormal immunological findings in serum: Secondary | ICD-10-CM | POA: Diagnosis not present

## 2017-05-09 DIAGNOSIS — M4690 Unspecified inflammatory spondylopathy, site unspecified: Secondary | ICD-10-CM | POA: Diagnosis not present

## 2017-05-13 LAB — BASIC METABOLIC PANEL
BUN: 13 (ref 4–21)
Creatinine: 0.8 (ref 0.5–1.1)
Glucose: 91
Potassium: 4.6 (ref 3.4–5.3)
Sodium: 141 (ref 137–147)

## 2017-05-13 LAB — CBC AND DIFFERENTIAL
HCT: 40 (ref 36–46)
HEMOGLOBIN: 12.8 (ref 12.0–16.0)
Platelets: 214 (ref 150–399)
WBC: 4.4

## 2017-05-13 LAB — HEPATIC FUNCTION PANEL
ALT: 17 (ref 7–35)
AST: 14 (ref 13–35)
Alkaline Phosphatase: 54 (ref 25–125)
Bilirubin, Total: 0.4

## 2017-05-19 ENCOUNTER — Encounter: Payer: Self-pay | Admitting: Nurse Practitioner

## 2017-05-19 DIAGNOSIS — B351 Tinea unguium: Secondary | ICD-10-CM | POA: Diagnosis not present

## 2017-05-19 DIAGNOSIS — L308 Other specified dermatitis: Secondary | ICD-10-CM | POA: Diagnosis not present

## 2017-05-23 ENCOUNTER — Encounter: Payer: Self-pay | Admitting: Nurse Practitioner

## 2017-06-10 DIAGNOSIS — Z01419 Encounter for gynecological examination (general) (routine) without abnormal findings: Secondary | ICD-10-CM | POA: Diagnosis not present

## 2017-06-10 DIAGNOSIS — Z800 Family history of malignant neoplasm of digestive organs: Secondary | ICD-10-CM | POA: Diagnosis not present

## 2017-06-10 DIAGNOSIS — Z808 Family history of malignant neoplasm of other organs or systems: Secondary | ICD-10-CM | POA: Diagnosis not present

## 2017-06-10 DIAGNOSIS — Z6821 Body mass index (BMI) 21.0-21.9, adult: Secondary | ICD-10-CM | POA: Diagnosis not present

## 2017-06-10 DIAGNOSIS — Z803 Family history of malignant neoplasm of breast: Secondary | ICD-10-CM | POA: Diagnosis not present

## 2017-06-17 ENCOUNTER — Encounter: Payer: Self-pay | Admitting: Gastroenterology

## 2017-06-17 ENCOUNTER — Ambulatory Visit: Payer: 59 | Admitting: Gastroenterology

## 2017-06-17 VITALS — BP 114/72 | HR 66 | Ht 65.0 in | Wt 129.0 lb

## 2017-06-17 DIAGNOSIS — K5190 Ulcerative colitis, unspecified, without complications: Secondary | ICD-10-CM | POA: Diagnosis not present

## 2017-06-17 NOTE — Patient Instructions (Addendum)
You will be set up for a colonoscopy sometime in July 2019 (for high risk screening given long standing extensive colitis).  It has been recommended to you by your physician that you have a colonoscopy completed. Per your request, we did not schedule the procedure(s) today. Please contact our office at [phone] should you decide to have the procedure completed. Please call the office in May to schedule your appointment.Marland Kitchen

## 2017-06-17 NOTE — Progress Notes (Signed)
1. Extensive ulcerative colitis: Was pregnant, diagnosed with UC in 2003.First med with asocol and rowasa. Then off meds for 8 years without any symptoms (no diarrhea). Started to flare in 2012 (bloody diarrhea), was on steroids for about a year, restarted asacol. Reactive arthritis (migratory large joints). Shestarted on humira 2012 by rheumtologist. Everything improved (joints and GI symptoms).Colonoscopy January 2014, [place], described to have a normal cecum and ileocecal valve, there was minimal erythema throughout the colon, random biopsies were taken. Biopsies showed active ulcerative colitis without dysplasia. Colonoscopy September 2012 in [place], described as having a normal terminal ileum. TI was biopsied to rule out inflammation. There was mild erythema and granularity throughout the entire colon. Random biopsies were taken. Biopsies showed mild chronic ileitis and active colitis. Colonoscopy in 2003 and 2005. These were both in [place]. I only have hand written reports and they are completely illegible. A biopsy from the 2003 colonoscopy does show chronic, active colitis.   Established care Dr. Ardis Hughs 2016, no change in meds, continue humira every other week.  TB skin test 03/2017 was negative    HPI: This is a very pleasant 42 year old woman whom I last saw about a year and a half ago.  She has extensive ulcerative colitis diagnosed 2003.  Bowels are good; no bleeding, solid stools (once a day). Has mild chronic left sided pains.  Still on humira every other week.  On 37m once a week (both prescribed by rheumatology).  I last saw her about a year and a half ago, August 2017, at that point she was doing well.  I recommended she continue her Humira every other week and follow-up with me in 6 months.  We have not heard from her since then.  Chief complaint is extensive ulcerative colitis  ROS: complete GI ROS as described in HPI, all other review  negative.  Constitutional:  No unintentional weight loss   Past Medical History:  Diagnosis Date  . Arthritis 2012   reactive arthritis  . History of kidney disease as a child   . [facility] [facility] ([facility]   . Ulcerative colitis ([facility]     Past Surgical History:  Procedure Laterality Date  . TUBAL LIGATION      Current Outpatient Medications  Medication Sig Dispense Refill  . acetaminophen (TYLENOL) 500 MG tablet Take 1,000 mg by mouth every 12 (twelve) hours as needed.    . Adalimumab (HUMIRA) 40 MG/0.8ML PSKT Inject 40 mg into the skin every 14 (fourteen) days.    . Biotin (BIOTIN MAXIMUM STRENGTH) 10 MG TABS Take 10 mg by mouth daily.    . cetirizine (ZYRTEC) 10 MG tablet Take 10 mg by mouth daily.    . Cholecalciferol (VITAMIN D PO) Take 1 tablet by mouth daily.    . Cyanocobalamin (VITAMIN B-12 PO) Take 1 tablet by mouth daily.    . folic acid (FOLVITE) 1 MG tablet Take 1 mg by mouth daily.    .Marland Kitchenleucovorin (WELLCOVORIN) 10 MG tablet Take by mouth once a week.    . methotrexate (RHEUMATREX) 10 MG tablet Take 10 mg by mouth once a week. Caution: Chemotherapy. Protect from light.    . NON FORMULARY 5 HTP vitamin supplement     No current facility-administered medications for this visit.     Allergies as of 06/17/2017 - Review Complete 06/17/2017  Allergen Reaction Noted  . Iodine  02/16/2014    Family History  Problem Relation Age of Onset  . Cancer Mother  29       Breast  . Hypertension Mother   . Cancer Father 36       bone  . Ulcerative colitis Brother 35  . Diabetes Brother   . Diabetes Maternal Grandmother     Social History   Socioeconomic History  . Marital status: Married    Spouse name: Not on file  . Number of children: Not on file  . Years of education: Not on file  . Highest education level: Not on file  Occupational History  . Not on file  Social Needs  . Financial resource strain: Not on file  . Food insecurity:    Worry: Not on file     Inability: Not on file  . Transportation needs:    Medical: Not on file    Non-medical: Not on file  Tobacco Use  . Smoking status: Never Smoker  . Smokeless tobacco: Never Used  Substance and Sexual Activity  . Alcohol use: No    Alcohol/week: 0.0 oz    Comment: occ glass of wine  . Drug use: No  . Sexual activity: Yes    Birth control/protection: Surgical  Lifestyle  . Physical activity:    Days per week: Not on file    Minutes per session: Not on file  . Stress: Not on file  Relationships  . Social connections:    Talks on phone: Not on file    Gets together: Not on file    Attends religious service: Not on file    Active member of club or organization: Not on file    Attends meetings of clubs or organizations: Not on file    Relationship status: Not on file  . Intimate partner violence:    Fear of current or ex partner: Not on file    Emotionally abused: Not on file    Physically abused: Not on file    Forced sexual activity: Not on file  Other Topics Concern  . Not on file  Social History Narrative  . Not on file     Physical Exam: BP 114/72   Pulse 66   Ht 5' 5"  (1.651 m)   Wt 129 lb (58.5 kg)   BMI 21.47 kg/m  Constitutional: generally well-appearing Psychiatric: alert and oriented x3 Abdomen: soft, nontender, nondistended, no obvious ascites, no peritoneal signs, normal bowel sounds No peripheral edema noted in lower extremities  Assessment and plan: 42 y.o. female with extensive ulcerative colitis, originally diagnosed 2003  Her symptoms seem to be under very good control on Humira that has been prescribed by her rheumatologist for rheumatoid arthritis.  The past 2 or 3 months she was also put on oral once weekly methotrexate for persistent arthritis problems.  Her bowel symptoms are under very good control.  She does have mild chronic left-sided abdominal pains which might be related to underlying colitis.  It has been 5 years since her last colonoscopy  and since she has had ulcerative colitis for 16 years, extensive, I recommended screening colonoscopy at her soonest convenience.  I see no reason for any further blood tests or imaging studies prior to then.  Please see the "Patient Instructions" section for addition details about the plan.  Owens Loffler, MD [facility] 06/17/2017, 10:12 AM

## 2017-07-29 DIAGNOSIS — Z809 Family history of malignant neoplasm, unspecified: Secondary | ICD-10-CM | POA: Diagnosis not present

## 2017-08-06 DIAGNOSIS — M4690 Unspecified inflammatory spondylopathy, site unspecified: Secondary | ICD-10-CM | POA: Diagnosis not present

## 2017-08-06 DIAGNOSIS — K51919 Ulcerative colitis, unspecified with unspecified complications: Secondary | ICD-10-CM | POA: Diagnosis not present

## 2017-08-06 DIAGNOSIS — R768 Other specified abnormal immunological findings in serum: Secondary | ICD-10-CM | POA: Diagnosis not present

## 2017-08-07 LAB — CBC AND DIFFERENTIAL
HCT: 41 (ref 36–46)
Hemoglobin: 13.3 (ref 12.0–16.0)
Platelets: 226 (ref 150–399)
WBC: 5.2

## 2017-08-07 LAB — BASIC METABOLIC PANEL
BUN: 14 (ref 4–21)
Creatinine: 0.6 (ref 0.5–1.1)
Glucose: 93
Potassium: 4.5 (ref 3.4–5.3)
Sodium: 140 (ref 137–147)

## 2017-08-07 LAB — HEPATIC FUNCTION PANEL
ALT: 9 (ref 7–35)
AST: 15 (ref 13–35)
Alkaline Phosphatase: 59 (ref 25–125)
Bilirubin, Total: 0.3

## 2017-10-02 ENCOUNTER — Encounter: Payer: 59 | Admitting: Nurse Practitioner

## 2017-11-04 ENCOUNTER — Telehealth: Payer: Self-pay | Admitting: Gastroenterology

## 2017-11-04 ENCOUNTER — Encounter: Payer: Self-pay | Admitting: Gastroenterology

## 2017-11-04 DIAGNOSIS — K51919 Ulcerative colitis, unspecified with unspecified complications: Secondary | ICD-10-CM | POA: Diagnosis not present

## 2017-11-04 DIAGNOSIS — M4690 Unspecified inflammatory spondylopathy, site unspecified: Secondary | ICD-10-CM | POA: Diagnosis not present

## 2017-11-04 DIAGNOSIS — R768 Other specified abnormal immunological findings in serum: Secondary | ICD-10-CM | POA: Diagnosis not present

## 2017-11-04 NOTE — Telephone Encounter (Signed)
The pt called this morning and scheduled pre visit and colon.  She has spoken with her Rheumatologist.

## 2017-11-04 NOTE — Telephone Encounter (Signed)
Can you call her.  I just spoke with her rheumatologist and it seems as if Humira is not holding her in remission from a joint standpoint anymore.  He and I agreed that she should change to Remicade instead.  He will be working on making that change from their office.  Can you also please remind her that she and I had discussed a colonoscopy at her last visit and she was thinking about doing it this past July.  It would probably be good if we go ahead and get that scheduled for her at this point.

## 2017-11-28 DIAGNOSIS — M4690 Unspecified inflammatory spondylopathy, site unspecified: Secondary | ICD-10-CM | POA: Diagnosis not present

## 2017-11-28 DIAGNOSIS — K51919 Ulcerative colitis, unspecified with unspecified complications: Secondary | ICD-10-CM | POA: Diagnosis not present

## 2017-11-28 LAB — POCT ERYTHROCYTE SEDIMENTATION RATE, NON-AUTOMATED: Sed Rate: 46

## 2017-11-28 LAB — BASIC METABOLIC PANEL
BUN: 14 (ref 4–21)
Creatinine: 0.8 (ref 0.5–1.1)
Glucose: 81
Potassium: 4.2 (ref 3.4–5.3)
Sodium: 141 (ref 137–147)

## 2017-11-28 LAB — CBC AND DIFFERENTIAL
HCT: 39 (ref 36–46)
Hemoglobin: 12.3 (ref 12.0–16.0)
Platelets: 294 (ref 150–399)
WBC: 5.8

## 2017-11-28 LAB — HEPATIC FUNCTION PANEL
ALT: 7 (ref 7–35)
AST: 14 (ref 13–35)
Alkaline Phosphatase: 64 (ref 25–125)
Bilirubin, Total: 0.2

## 2017-12-03 ENCOUNTER — Encounter: Payer: 59 | Admitting: Nurse Practitioner

## 2017-12-09 ENCOUNTER — Ambulatory Visit (AMBULATORY_SURGERY_CENTER): Payer: Self-pay

## 2017-12-09 ENCOUNTER — Encounter: Payer: Self-pay | Admitting: Gastroenterology

## 2017-12-09 VITALS — Ht 65.0 in | Wt 131.6 lb

## 2017-12-09 DIAGNOSIS — K5190 Ulcerative colitis, unspecified, without complications: Secondary | ICD-10-CM

## 2017-12-09 MED ORDER — PEG 3350-KCL-NA BICARB-NACL 420 G PO SOLR: 4000.0000 mL | Freq: Once | ORAL | 0 refills | Status: AC

## 2017-12-09 NOTE — Progress Notes (Signed)
Per pt, no allergies to soy or egg products.Pt not taking any weight loss meds or using  O2 at home.  Pt refused emmi video. 

## 2017-12-15 DIAGNOSIS — R768 Other specified abnormal immunological findings in serum: Secondary | ICD-10-CM | POA: Diagnosis not present

## 2017-12-15 DIAGNOSIS — Z23 Encounter for immunization: Secondary | ICD-10-CM | POA: Diagnosis not present

## 2017-12-15 DIAGNOSIS — M4690 Unspecified inflammatory spondylopathy, site unspecified: Secondary | ICD-10-CM | POA: Diagnosis not present

## 2017-12-15 DIAGNOSIS — K51919 Ulcerative colitis, unspecified with unspecified complications: Secondary | ICD-10-CM | POA: Diagnosis not present

## 2017-12-16 ENCOUNTER — Encounter: Payer: 59 | Admitting: Nurse Practitioner

## 2017-12-17 ENCOUNTER — Encounter: Payer: 59 | Admitting: Nurse Practitioner

## 2017-12-19 DIAGNOSIS — K51919 Ulcerative colitis, unspecified with unspecified complications: Secondary | ICD-10-CM | POA: Diagnosis not present

## 2017-12-22 ENCOUNTER — Encounter: Payer: Self-pay | Admitting: Gastroenterology

## 2017-12-22 ENCOUNTER — Ambulatory Visit (AMBULATORY_SURGERY_CENTER): Payer: 59 | Admitting: Gastroenterology

## 2017-12-22 VITALS — BP 126/78 | HR 63 | Temp 99.6°F | Resp 14 | Ht 65.0 in | Wt 131.0 lb

## 2017-12-22 DIAGNOSIS — K5190 Ulcerative colitis, unspecified, without complications: Secondary | ICD-10-CM | POA: Diagnosis not present

## 2017-12-22 DIAGNOSIS — D122 Benign neoplasm of ascending colon: Secondary | ICD-10-CM

## 2017-12-22 DIAGNOSIS — Z8719 Personal history of other diseases of the digestive system: Secondary | ICD-10-CM

## 2017-12-22 DIAGNOSIS — K635 Polyp of colon: Secondary | ICD-10-CM

## 2017-12-22 MED ORDER — SODIUM CHLORIDE 0.9 % IV SOLN: 500.0000 mL | Freq: Once | INTRAVENOUS | Status: DC

## 2017-12-22 NOTE — Progress Notes (Signed)
Called to room to assist during endoscopic procedure.  Patient ID and intended procedure confirmed with present staff. Received instructions for my participation in the procedure from the performing physician.  

## 2017-12-22 NOTE — Patient Instructions (Signed)
Discharge instructions given. Handout on polyps. Biopsies taken. Resume previous medications. YOU HAD AN ENDOSCOPIC PROCEDURE TODAY AT [facility]:   Refer to the procedure report that was given to you for any specific questions about what was found during the examination.  If the procedure report does not answer your questions, please call your gastroenterologist to clarify.  If you requested that your care partner not be given the details of your procedure findings, then the procedure report has been included in a sealed envelope for you to review at your convenience later.  YOU SHOULD EXPECT: Some feelings of bloating in the abdomen. Passage of more gas than usual.  Walking can help get rid of the air that was put into your GI tract during the procedure and reduce the bloating. If you had a lower endoscopy (such as a colonoscopy or flexible sigmoidoscopy) you may notice spotting of blood in your stool or on the toilet paper. If you underwent a bowel prep for your procedure, you may not have a normal bowel movement for a few days.  Please Note:  You might notice some irritation and congestion in your nose or some drainage.  This is from the oxygen used during your procedure.  There is no need for concern and it should clear up in a day or so.  SYMPTOMS TO REPORT IMMEDIATELY:   Following lower endoscopy (colonoscopy or flexible sigmoidoscopy):  Excessive amounts of blood in the stool  Significant tenderness or worsening of abdominal pains  Swelling of the abdomen that is new, acute  Fever of 100F or higher   For urgent or emergent issues, a gastroenterologist can be reached at any hour by calling [phone].   DIET:  We do recommend a small meal at first, but then you may proceed to your regular diet.  Drink plenty of fluids but you should avoid alcoholic beverages for 24 hours.  ACTIVITY:  You should plan to take it easy for the rest of today and you should NOT DRIVE  or use heavy machinery until tomorrow (because of the sedation medicines used during the test).    FOLLOW UP: Our staff will call the number listed on your records the next business day following your procedure to check on you and address any questions or concerns that you may have regarding the information given to you following your procedure. If we do not reach you, we will leave a message.  However, if you are feeling well and you are not experiencing any problems, there is no need to return our call.  We will assume that you have returned to your regular daily activities without incident.  If any biopsies were taken you will be contacted by phone or by letter within the next 1-3 weeks.  Please call us at [phone] if you have not heard about the biopsies in 3 weeks.    SIGNATURES/CONFIDENTIALITY: You and/or your care partner have signed paperwork which will be entered into your electronic medical record.  These signatures attest to the fact that that the information above on your After Visit Summary has been reviewed and is understood.  Full responsibility of the confidentiality of this discharge information lies with you and/or your care-partner.

## 2017-12-22 NOTE — Op Note (Signed)
Laura Finley Patient Name: Laura Finley Procedure Date: 12/22/2017 1:47 PM MRN: [id number] Endoscopist: Milus Banister , MD Age: 42 Referring MD:  Date of Birth: 1975-12-15 Gender: Female Account #: [id number] Procedure:                Colonoscopy Indications:              Ulcerative colitis; long standing-extensive UC,                            diagnosed 16 years ago. Medicines:                Monitored Anesthesia Care Procedure:                Pre-Anesthesia Assessment:                           - Prior to the procedure, a History and Physical                            was performed, and patient medications and                            allergies were reviewed. The patient's tolerance of                            previous anesthesia was also reviewed. The risks                            and benefits of the procedure and the sedation                            options and risks were discussed with the patient.                            All questions were answered, and informed consent                            was obtained. Prior Anticoagulants: The patient has                            taken no previous anticoagulant or antiplatelet                            agents. ASA Grade Assessment: II - A patient with                            mild systemic disease. After reviewing the risks                            and benefits, the patient was deemed in                            satisfactory condition to undergo the procedure.  After obtaining informed consent, the colonoscope                            was passed under direct vision. Throughout the                            procedure, the patient's blood pressure, pulse, and                            oxygen saturations were monitored continuously. The                            Colonoscope was introduced through the anus and                            advanced to the the  terminal ileum. The colonoscopy                            was performed without difficulty. The patient                            tolerated the procedure well. The quality of the                            bowel preparation was good. The terminal ileum,                            ileocecal valve, appendiceal orifice, and rectum                            were photographed. Scope In: 1:51:37 PM Scope Out: 2:03:34 PM Scope Withdrawal Time: 0 hours 8 minutes 16 seconds  Total Procedure Duration: 0 hours 11 minutes 57 seconds  Findings:                 The terminal ileum appeared normal.                           The colonic mucosa was essentially normal                            throughout (slight granularity in the left colon).                            Biopsies were taken extenstive from right (jar 1)                            and left (jar 2) colon segments.                           A 2 mm polyp was found in the ascending colon. The                            polyp was pedunculated. The polyp was removed with  a cold snare. Resection and retrieval were complete.                           The exam was otherwise without abnormality on                            direct and retroflexion views. Complications:            No immediate complications. Estimated blood loss:                            None. Estimated Blood Loss:     Estimated blood loss: none. Impression:               - The examined portion of the ileum was normal.                           - One 2 mm polyp in the ascending colon, removed                            with a cold snare. Resected and retrieved.                           - Extensive biospies take from the right and left                            colon to check for dysplasia given long-standing                            disease which appears to be under very good                            clinical and endoscopic control  currently. Recommendation:           - Patient has a contact number available for                            emergencies. The signs and symptoms of potential                            delayed complications were discussed with the                            patient. Return to normal activities tomorrow.                            Written discharge instructions were provided to the                            patient.                           - Resume previous diet.                           - Continue present  medications.                           - Repeat colonoscopy is recommended. The                            colonoscopy date will be determined after pathology                            results from today's exam become available for                            review. Milus Banister, MD 12/22/2017 2:09:16 PM This report has been signed electronically.

## 2017-12-22 NOTE — Progress Notes (Signed)
Report to PACU, RN, vss, BBS= Clear.  

## 2017-12-22 NOTE — Progress Notes (Signed)
Pt's states no medical or surgical changes since previsit or office visit. 

## 2017-12-23 ENCOUNTER — Telehealth: Payer: Self-pay

## 2017-12-23 NOTE — Telephone Encounter (Signed)
Pulled patient up for the second time by accident

## 2017-12-23 NOTE — Telephone Encounter (Deleted)
  Follow up Call-  Call back number 12/22/2017  Post procedure Call Back phone  # [phone]  Permission to leave phone message Yes  Some recent data might be hidden     Patient questions:  Do you have a fever, pain , or abdominal swelling? {yes no:314532} Pain Score  {NUMBERS; 0-10:5044} *  Have you tolerated food without any problems? {yes no:314532}  Have you been able to return to your normal activities? {yes no:314532}  Do you have any questions about your discharge instructions: Diet   {yes no:314532} Medications  {yes no:314532} Follow up visit  {yes no:314532}  Do you have questions or concerns about your Care? {yes no:314532}  Actions: * If pain score is 4 or above: {ACTION; [facility] ENDO PAIN >4:21563::"No action needed, pain <4."}

## 2017-12-23 NOTE — Telephone Encounter (Signed)
  Follow up Call-  Call back number 12/22/2017  Post procedure Call Back phone  # [phone]  Permission to leave phone message Yes  Some recent data might be hidden     Patient questions:  Do you have a fever, pain , or abdominal swelling? No. Pain Score  0 *  Have you tolerated food without any problems? Yes.  Have you been able to return to your normal activities? Yes.    Do you have any questions about your discharge instructions: Diet   No. Medications  No. Follow up visit  No.  Do you have questions or concerns about your Care? No.  Actions: * If pain score is 4 or above: No action needed, pain <4.

## 2018-01-20 DIAGNOSIS — K51919 Ulcerative colitis, unspecified with unspecified complications: Secondary | ICD-10-CM | POA: Diagnosis not present

## 2018-01-22 IMAGING — MG 2D DIGITAL SCREENING BILATERAL MAMMOGRAM WITH CAD AND ADJUNCT TO
9 of 12 series · 9 of 28 positions shown · non-contrast
Comparison: Previous exam(s).

CLINICAL DATA: Screening.

EXAM:
2D DIGITAL SCREENING BILATERAL MAMMOGRAM WITH CAD AND ADJUNCT TOMO

[R CC synth-2D]
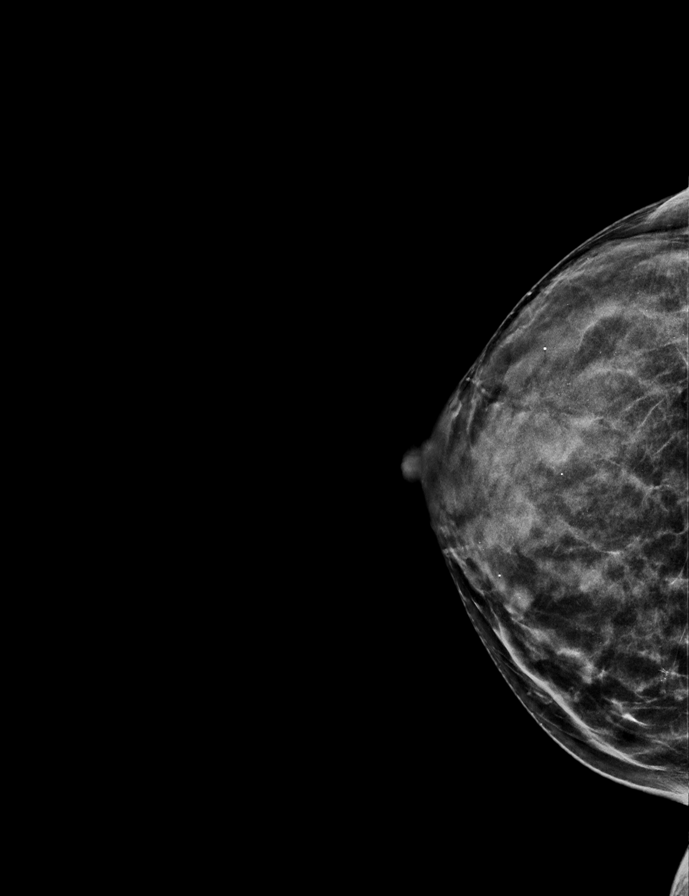

[R MLO]
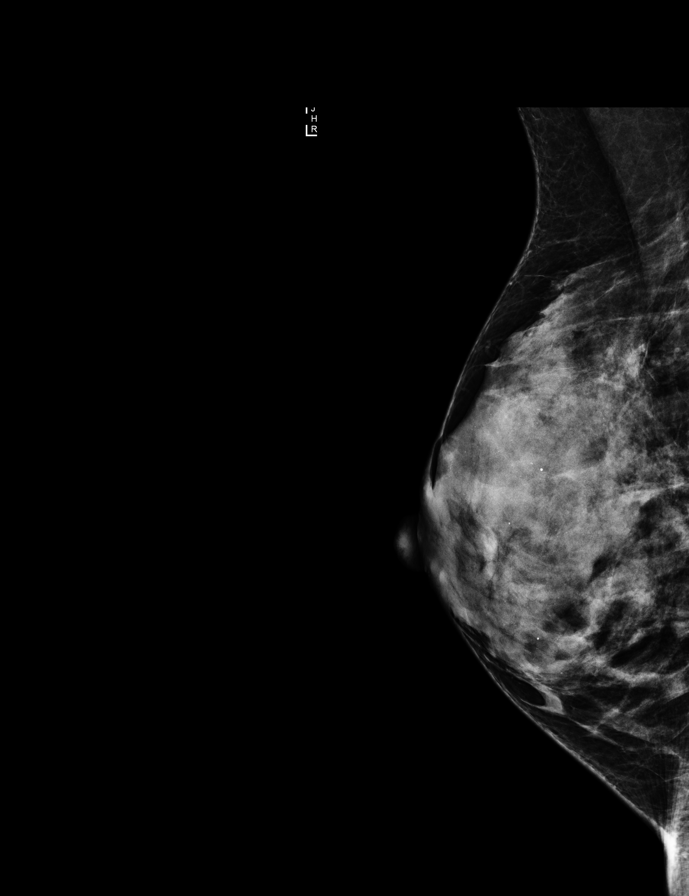

[L CC]
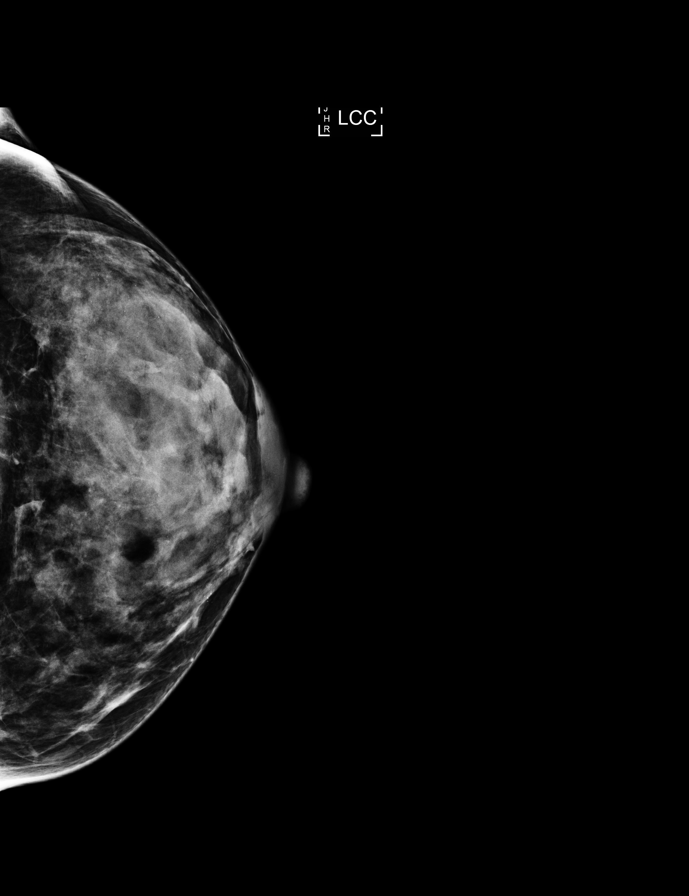

[R MLO synth-2D]
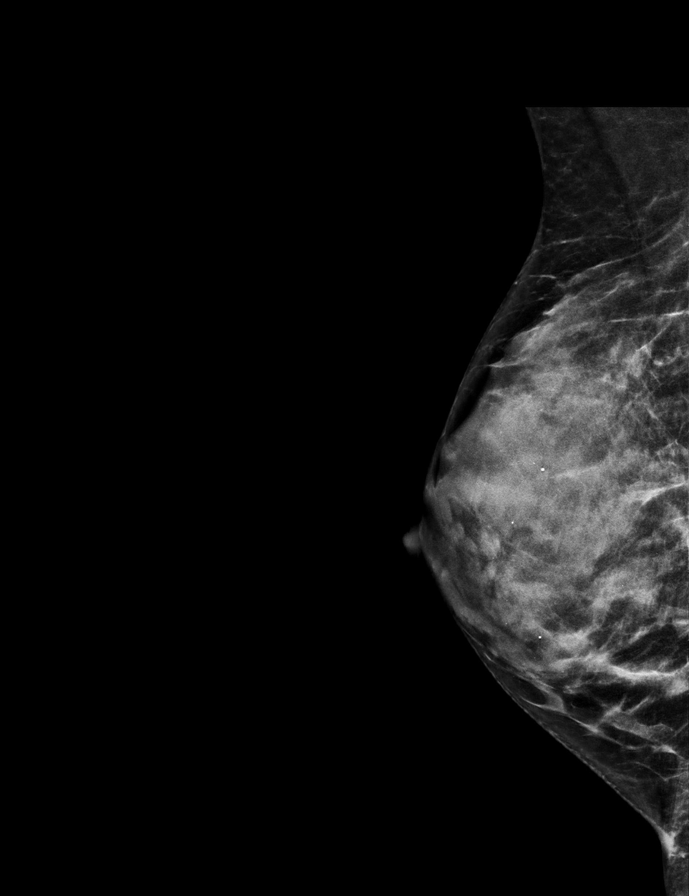

[L MLO synth-2D]
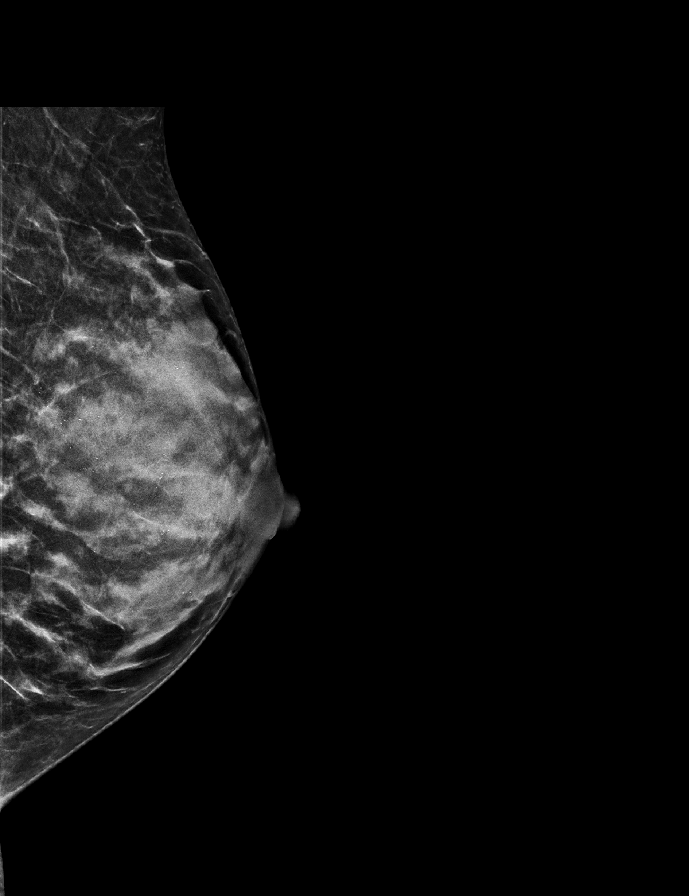

[L CC synth-2D]
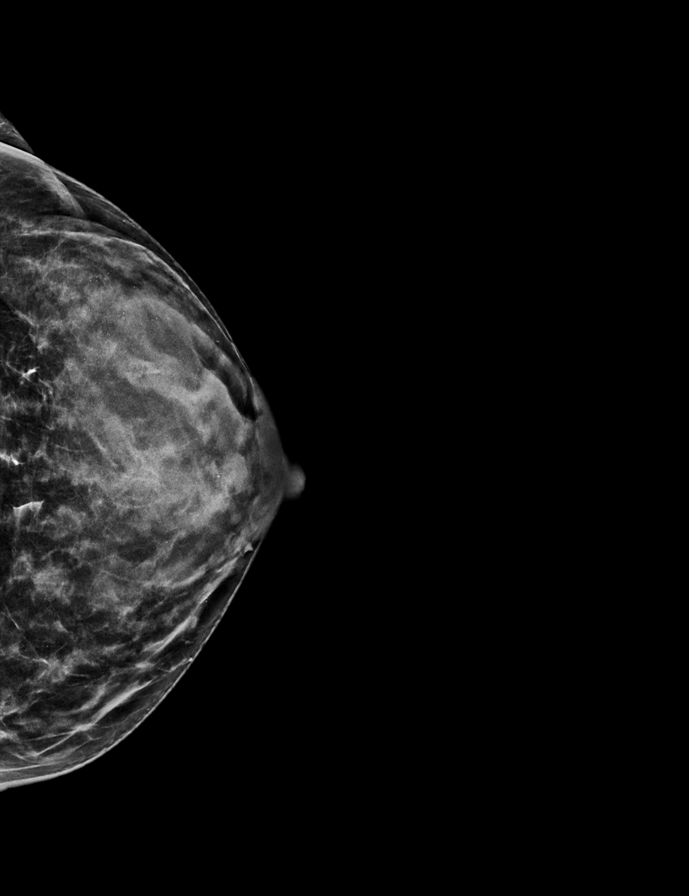

[R CC]
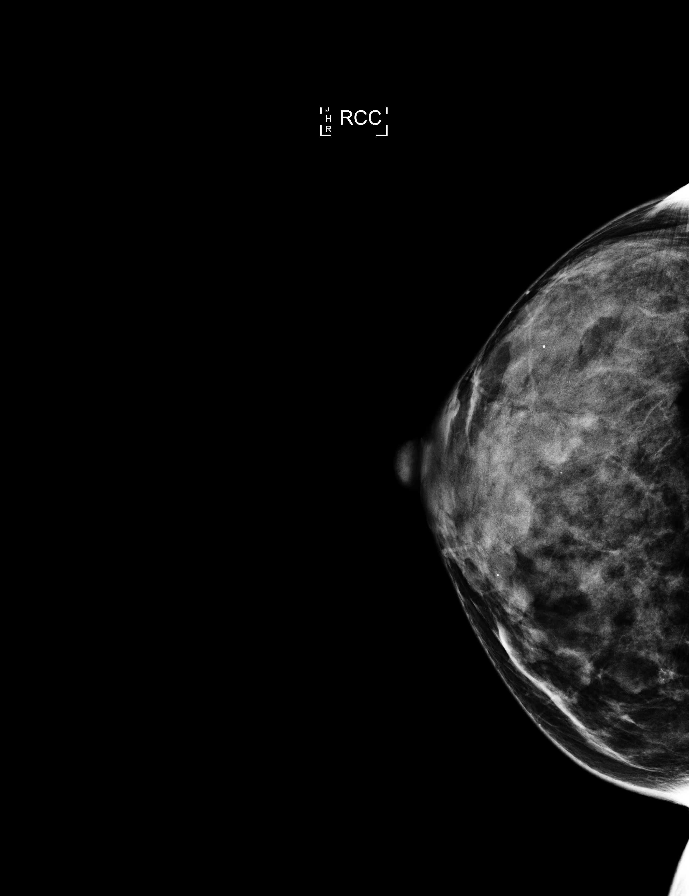

[L MLO]
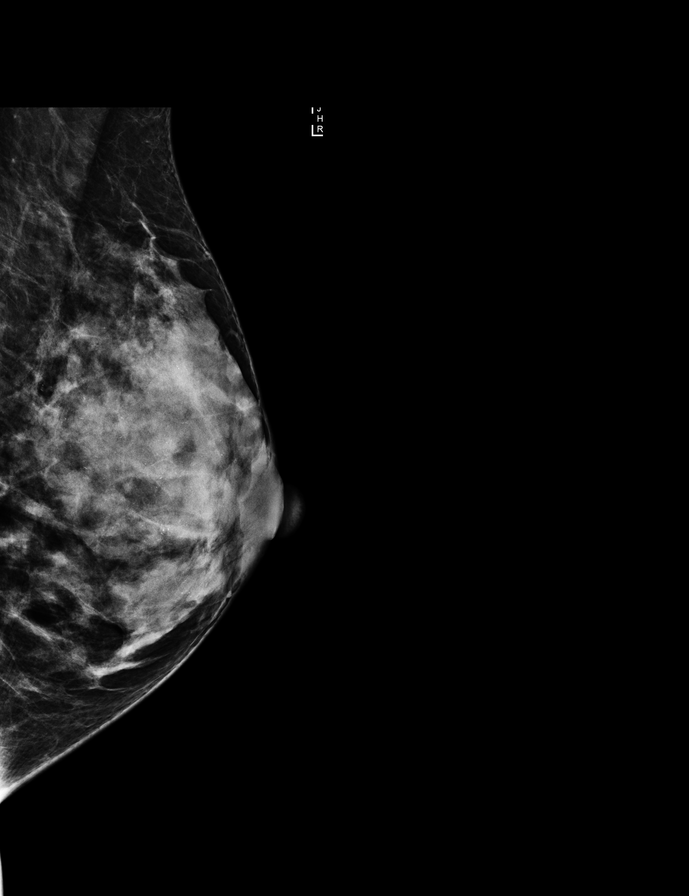

[L CC tomo · tomo slice 25/50.0]
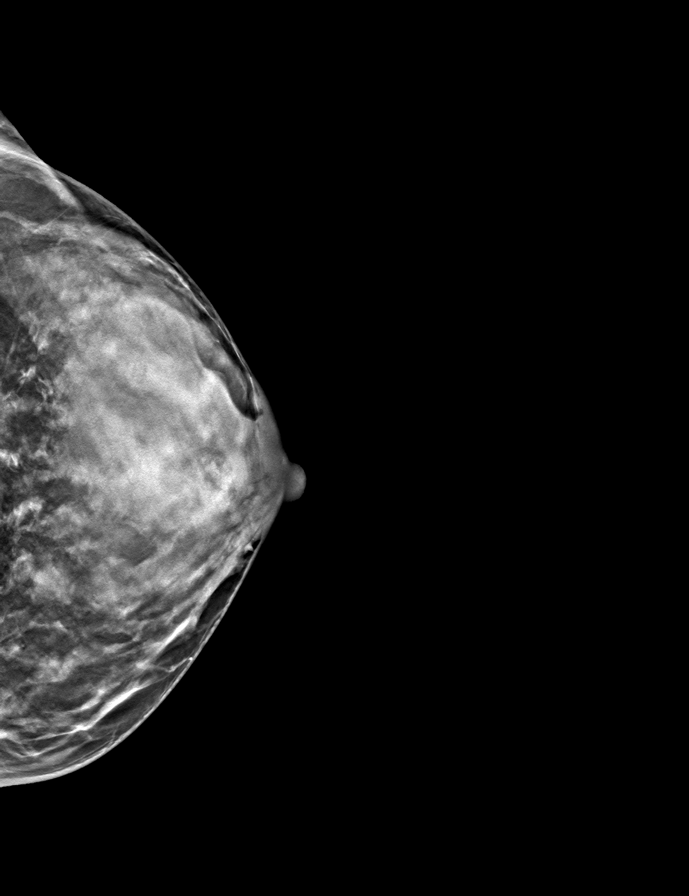

[9 of 28 positions shown; findings below may reference images not displayed]

ACR Breast Density Category d: The breast tissue is extremely dense,
which lowers the sensitivity of mammography.
FINDINGS: There are no findings suspicious for malignancy. Images were
processed with CAD.
IMPRESSION: No mammographic evidence of malignancy. A result letter of this
screening mammogram will be mailed directly to the patient.

RECOMMENDATION:
Screening mammogram in one year. (Code:US-D-RZ7)

BI-RADS CATEGORY  1: Negative.

## 2018-01-28 ENCOUNTER — Encounter: Payer: Self-pay | Admitting: Nurse Practitioner

## 2018-01-28 ENCOUNTER — Ambulatory Visit (INDEPENDENT_AMBULATORY_CARE_PROVIDER_SITE_OTHER): Payer: 59 | Admitting: Nurse Practitioner

## 2018-01-28 VITALS — BP 122/72 | HR 72 | Temp 98.7°F | Ht 65.0 in | Wt 126.0 lb

## 2018-01-28 DIAGNOSIS — M0230 Reiter's disease, unspecified site: Secondary | ICD-10-CM

## 2018-01-28 DIAGNOSIS — Z803 Family history of malignant neoplasm of breast: Secondary | ICD-10-CM

## 2018-01-28 DIAGNOSIS — K5190 Ulcerative colitis, unspecified, without complications: Secondary | ICD-10-CM | POA: Diagnosis not present

## 2018-01-28 DIAGNOSIS — Z0000 Encounter for general adult medical examination without abnormal findings: Secondary | ICD-10-CM

## 2018-01-28 NOTE — Patient Instructions (Signed)

## 2018-01-28 NOTE — Progress Notes (Signed)
Careteam: Patient Care Team: Lauree Chandler, NP as PCP - General (Nurse Practitioner)  Advanced Directive information Does Patient Have a Medical Advance Directive?: No  Allergies  Allergen Reactions  . Iodine     Rash on face, coughing,    Chief Complaint  Patient presents with  . Medical Management of Chronic Issues    Pt is being seen for a physical.      HPI: Patient is a 42 y.o. female seen in the office today for physical.  Following with rheumatologist, gastroenterologist, gynecologist and dermatologist.  Recent flare in RA- continues on methotrexate Off humira and started on remicade, next infusion in January.  Reports she is having a lot more pain. Reports pain is worse when it is a humid cold.  Using ibuprofen and tylenol.  Reports that she has a "deal" if she is a symptomatic of her colitis then she can take NSAID but if she has ANY symptoms she stops immediately.  Aware she should avoid NSAID but sometimes her pain is bad and it is only thing that will help.  Will take ibuprofen with tylenol to get the best relief, will only use ibuprofen twice daily  Did the anti-inflammatory diet and felt SO GOOD but got to be really skinny so she got off of it.   Getting blood work done rheumatologist   Review of Systems:  Review of Systems  Constitutional: Negative for chills, fever and weight loss.  HENT: Negative for tinnitus.   Respiratory: Negative for cough, sputum production and shortness of breath.   Cardiovascular: Negative for chest pain, palpitations and leg swelling.  Gastrointestinal: Negative for abdominal pain, constipation, diarrhea and heartburn.  Genitourinary: Negative for dysuria, frequency and urgency.  Musculoskeletal: Positive for joint pain (RA). Negative for back pain, falls and myalgias.  Skin: Negative.   Neurological: Negative for dizziness and headaches.  Psychiatric/Behavioral: Negative for depression and memory loss. The patient does  not have insomnia.     Past Medical History:  Diagnosis Date  . Arthritis 2012   reactive arthritis  . History of kidney disease as a child   . Sjogren's syndrome ([place])   . Ulcerative colitis [facility])    Past Surgical History:  Procedure Laterality Date  . COLONOSCOPY    . TUBAL LIGATION     Social History:   reports that she has never smoked. She has never used smokeless tobacco. She reports that she does not drink alcohol or use drugs.  Family History  Problem Relation Age of Onset  . Cancer Mother 66       Breast  . Hypertension Mother   . Cancer Father 37       bone  . Ulcerative colitis Brother 74  . Diabetes Brother   . Diabetes Maternal Grandmother   . Stomach cancer Maternal Grandmother   . Colon cancer Neg Hx   . Colon polyps Neg Hx   . Esophageal cancer Neg Hx   . Rectal cancer Neg Hx     Medications: Patient's Medications  New Prescriptions   No medications on file  Previous Medications   ACETAMINOPHEN (TYLENOL) 500 MG TABLET    Take 1,000 mg by mouth every 12 (twelve) hours as needed.   BIOTIN (BIOTIN MAXIMUM STRENGTH) 10 MG TABS    Take 10 mg by mouth daily.   CETIRIZINE (ZYRTEC) 10 MG TABLET    Take 10 mg by mouth as needed.    CHOLECALCIFEROL (VITAMIN D PO)  Take 1 tablet by mouth daily.   CYANOCOBALAMIN (VITAMIN B-12 PO)    Take 1 tablet by mouth daily.   FOLIC ACID (FOLVITE) 1 MG TABLET    Take 1 mg by mouth daily.   IBUPROFEN (ADVIL,MOTRIN) 200 MG TABLET    Take 200 mg by mouth as needed.   INFLIXIMAB (REMICADE IV)    Inject into the vein. Remicade infusion-As directed/ first infusion was 11/28/17   LEUCOVORIN (WELLCOVORIN) 5 MG TABLET    Take 5 mg by mouth once a week.   METHOTREXATE (RHEUMATREX) 10 MG TABLET    Take 10 mg by mouth once a week. Caution: Chemotherapy. Protect from light.   NON FORMULARY    daily. 5 HTP vitamin supplement   Modified Medications   No medications on file  Discontinued Medications   LEUCOVORIN (WELLCOVORIN) 10 MG  TABLET    Take by mouth once a week.     Physical Exam:  Vitals:   01/28/18 1326  BP: 122/72  Pulse: 72  Temp: 98.7 F (37.1 C)  TempSrc: Oral  SpO2: 95%  Weight: 126 lb (57.2 kg)  Height: 5' 5"  (1.651 m)   Body mass index is 20.97 kg/m.  Physical Exam  Constitutional: She is oriented to person, place, and time. She appears well-developed and well-nourished. No distress.  HENT:  Head: Normocephalic and atraumatic.  Mouth/Throat: Oropharynx is clear and moist.  Eyes: Pupils are equal, round, and reactive to light. Conjunctivae are normal.  Neck: Normal range of motion.  Cardiovascular: Normal rate, regular rhythm and normal heart sounds.  Pulmonary/Chest: Effort normal and breath sounds normal.  Abdominal: Soft. Bowel sounds are normal.  Genitourinary:  Genitourinary Comments: By GYN  Neurological: She is alert and oriented to person, place, and time.  Skin: Skin is warm and dry. She is not diaphoretic.  Psychiatric: She has a normal mood and affect.    Labs reviewed: Basic Metabolic Panel: No results for input(s): NA, K, CL, CO2, GLUCOSE, BUN, CREATININE, CALCIUM, MG, PHOS, TSH in the last 8760 hours. Liver Function Tests: No results for input(s): AST, ALT, ALKPHOS, BILITOT, PROT, ALBUMIN in the last 8760 hours. No results for input(s): LIPASE, AMYLASE in the last 8760 hours. No results for input(s): AMMONIA in the last 8760 hours. CBC: No results for input(s): WBC, NEUTROABS, HGB, HCT, MCV, PLT in the last 8760 hours. Lipid Panel: No results for input(s): CHOL, HDL, LDLCALC, TRIG, CHOLHDL, LDLDIRECT in the last 8760 hours. TSH: No results for input(s): TSH in the last 8760 hours. A1C: No results found for: HGBA1C   Assessment/Plan 1. Wellness examination -without new findings today,  The patient was counseled regarding the appropriate use of alcohol, regular self-examination of the breasts on a monthly basis, prevention of dental and periodontal disease, diet,  regular sustained exercise for at least 30 minutes 5 times per week, routine screening interval for mammogram as recommended by the [place] and ACOG, importance of regular PAP smears,  and recommended schedule for GI hemoccult testing, colonoscopy, cholesterol, thyroid and diabetes screening.  2. Family history of breast cancer Following with GYN who is already monitoring mammograms.   3. Ulcerative colitis without complications, unspecified location ([place]) Stable, following with GI, taking ibuprofen only if she is not having any GI symptoms for severe pain.   4. Reactive arthritis [facility]) Following with rheumatology who are frequently checking labs, will request records for labs   Next appt:  Carlos American. Harle Battiest  [facility] [phone]

## 2018-02-16 DIAGNOSIS — R768 Other specified abnormal immunological findings in serum: Secondary | ICD-10-CM | POA: Diagnosis not present

## 2018-02-16 DIAGNOSIS — K51919 Ulcerative colitis, unspecified with unspecified complications: Secondary | ICD-10-CM | POA: Diagnosis not present

## 2018-02-16 DIAGNOSIS — I7300 Raynaud's syndrome without gangrene: Secondary | ICD-10-CM | POA: Diagnosis not present

## 2018-03-17 DIAGNOSIS — K5190 Ulcerative colitis, unspecified, without complications: Secondary | ICD-10-CM | POA: Diagnosis not present

## 2018-03-17 DIAGNOSIS — K51919 Ulcerative colitis, unspecified with unspecified complications: Secondary | ICD-10-CM | POA: Diagnosis not present

## 2018-05-12 DIAGNOSIS — K5190 Ulcerative colitis, unspecified, without complications: Secondary | ICD-10-CM | POA: Diagnosis not present

## 2018-05-19 DIAGNOSIS — I7300 Raynaud's syndrome without gangrene: Secondary | ICD-10-CM | POA: Diagnosis not present

## 2018-05-19 DIAGNOSIS — K51919 Ulcerative colitis, unspecified with unspecified complications: Secondary | ICD-10-CM | POA: Diagnosis not present

## 2018-05-19 DIAGNOSIS — R768 Other specified abnormal immunological findings in serum: Secondary | ICD-10-CM | POA: Diagnosis not present

## 2018-07-07 DIAGNOSIS — K5190 Ulcerative colitis, unspecified, without complications: Secondary | ICD-10-CM | POA: Diagnosis not present

## 2018-07-07 DIAGNOSIS — M1990 Unspecified osteoarthritis, unspecified site: Secondary | ICD-10-CM | POA: Diagnosis not present

## 2018-07-10 ENCOUNTER — Encounter: Payer: Self-pay | Admitting: Gastroenterology

## 2018-07-12 ENCOUNTER — Encounter: Payer: Self-pay | Admitting: Nurse Practitioner

## 2019-02-01 ENCOUNTER — Encounter: Payer: 59 | Admitting: Nurse Practitioner

## 2019-02-19 ENCOUNTER — Ambulatory Visit (INDEPENDENT_AMBULATORY_CARE_PROVIDER_SITE_OTHER): Payer: 59 | Admitting: Nurse Practitioner

## 2019-02-19 ENCOUNTER — Other Ambulatory Visit: Payer: Self-pay

## 2019-02-19 ENCOUNTER — Encounter: Payer: Self-pay | Admitting: Nurse Practitioner

## 2019-02-19 VITALS — BP 114/70 | HR 94 | Temp 99.1°F | Ht 65.0 in | Wt 143.0 lb

## 2019-02-19 DIAGNOSIS — K5190 Ulcerative colitis, unspecified, without complications: Secondary | ICD-10-CM | POA: Diagnosis not present

## 2019-02-19 DIAGNOSIS — M0230 Reiter's disease, unspecified site: Secondary | ICD-10-CM

## 2019-02-19 DIAGNOSIS — E785 Hyperlipidemia, unspecified: Secondary | ICD-10-CM | POA: Diagnosis not present

## 2019-02-19 DIAGNOSIS — Z0000 Encounter for general adult medical examination without abnormal findings: Secondary | ICD-10-CM | POA: Diagnosis not present

## 2019-02-19 NOTE — Progress Notes (Signed)
Provider: Lauree Chandler, NP  Patient Care Team: Lauree Chandler, NP as PCP - General (Nurse Practitioner)  Extended Emergency Contact Information Primary Emergency Contact: Reyes,Josue Address: [address]          [place], [place] [address] Johnnette Litter of [place] Phone: [phone] Mobile Phone: [phone] Relation: Spouse Allergies  Allergen Reactions  . Iodine     Rash on face, coughing,   Code Status: FULL Goals of Care: Advanced Directive information Advanced Directives 01/28/2018  Does Patient Have a Medical Advance Directive? No  Would patient like information on creating a medical advance directive? -     Chief Complaint  Patient presents with  . Annual Exam    Yearly physical. Patient has GYN, will schedule pap soon   . Advanced Directive    Discuss ACP   . Immunizations    Discuss need for TD/TDaP     HPI: Patient is a 43 y.o. female seen in today for an annual wellness exam.    Diet? Heart healthy  Exercise? Walking but not routinely    Dentition: routine follow up but due at this time  Ophthalmology appt: yearly   Routine specialist: GYN for PAP, Dr Lynnette Caffey orders mammogram  follows with rheumatologist, gastroenterologist, gynecologist and dermatologist.   Ulcerative colitis- controlled at this time.   Following with rheumatology on RA- using remicade, had bad flare and fatigue, fatigue improved and pain is manageable.  Has flares in bigger joins. Taking prednisone if she gets bad. Reports follow up with Rheumatologist yesterday and plans to get labs Monday   Depression screen [facility] 2/9 02/19/2019 01/28/2018 09/26/2016 09/11/2015 03/03/2014  Decreased Interest 0 0 0 0 0  Down, Depressed, Hopeless 0 0 0 0 0  PHQ - 2 Score 0 0 0 0 0    Fall Risk  02/19/2019 01/28/2018 09/26/2016 03/29/2016 09/11/2015  Falls in the past year? 1 0 No No No  Number falls in past yr: 0 - - - -  Injury with Fall? 0 - - - -   No flowsheet data found.    Health Maintenance  Topic Date Due  . TETANUS/TDAP  08/06/1994  . PAP SMEAR-Modifier  05/15/2018  . INFLUENZA VACCINE  10/17/2018  . HIV Screening  10/16/2023 (Originally 08/06/1990)  . COLONOSCOPY  12/22/2020    Past Medical History:  Diagnosis Date  . ANA positive   . Arthritis 2012   reactive arthritis  . Foot pain   . Hand pain   . High risk medication use   . History of kidney disease as a child   . Osteoarthritis    c spine  . Raynaud's disease   . Sacroiliitis ([place])   . Sicca complex ([place])   . Sjogren's syndrome ([place])   . Spondyloarthritis   . Ulcerative colitis [facility])     Past Surgical History:  Procedure Laterality Date  . COLONOSCOPY    . TUBAL LIGATION      Social History   Socioeconomic History  . Marital status: Married    Spouse name: Not on file  . Number of children: Not on file  . Years of education: Not on file  . Highest education level: Not on file  Occupational History  . Not on file  Social Needs  . Financial resource strain: Not on file  . Food insecurity    Worry: Not on file    Inability: Not on file  . Transportation needs    Medical: Not on  file    Non-medical: Not on file  Tobacco Use  . Smoking status: Never Smoker  . Smokeless tobacco: Never Used  Substance and Sexual Activity  . Alcohol use: Yes    Alcohol/week: 0.0 standard drinks    Comment: occ glass of wine  . Drug use: No  . Sexual activity: Yes    Birth control/protection: Surgical  Lifestyle  . Physical activity    Days per week: Not on file    Minutes per session: Not on file  . Stress: Not on file  Relationships  . Social Herbalist on phone: Not on file    Gets together: Not on file    Attends religious service: Not on file    Active member of club or organization: Not on file    Attends meetings of clubs or organizations: Not on file    Relationship status: Not on file  Other Topics Concern  . Not on file  Social History Narrative  .  Not on file    Family History  Problem Relation Age of Onset  . Cancer Mother 108       Breast  . Hypertension Mother   . Cancer Father 89       bone  . Ulcerative colitis Brother 55  . Diabetes Brother   . Gallbladder disease Daughter   . Diabetes Maternal Grandmother   . Stomach cancer Maternal Grandmother   . Colon cancer Neg Hx   . Colon polyps Neg Hx   . Esophageal cancer Neg Hx   . Rectal cancer Neg Hx     Review of Systems:  Review of Systems  Constitutional: Negative for chills, fever and weight loss.  HENT: Negative for tinnitus.   Respiratory: Negative for cough, sputum production and shortness of breath.   Cardiovascular: Negative for chest pain, palpitations and leg swelling.  Gastrointestinal: Negative for abdominal pain, constipation, diarrhea and heartburn.  Genitourinary: Negative for dysuria, frequency and urgency.  Musculoskeletal: Negative for back pain, falls, joint pain and myalgias.  Skin: Negative.   Neurological: Negative for dizziness and headaches.  Psychiatric/Behavioral: Negative for depression and memory loss. The patient does not have insomnia.      Allergies as of 02/19/2019      Reactions   Iodine    Rash on face, coughing,      Medication List       Accurate as of February 19, 2019  1:37 PM. If you have any questions, ask your nurse or doctor.        STOP taking these medications   NON FORMULARY Stopped by: Lauree Chandler, NP     TAKE these medications   acetaminophen 500 MG tablet Commonly known as: TYLENOL Take 1,000 mg by mouth every 12 (twelve) hours as needed.   Biotin Maximum Strength 10 MG Tabs Generic drug: Biotin Take 10 mg by mouth daily.   cetirizine 10 MG tablet Commonly known as: ZYRTEC Take 10 mg by mouth as needed.   folic acid 1 MG tablet Commonly known as: FOLVITE Take 1 mg by mouth daily.   ibuprofen 200 MG tablet Commonly known as: ADVIL Take 200 mg by mouth as needed.   L-THEANINE PO Take  by mouth at bedtime.   leucovorin 5 MG tablet Commonly known as: WELLCOVORIN Take 5 mg by mouth once a week.   MELATONIN PO Take 2 mg by mouth daily.   methotrexate 10 MG tablet Commonly known as: RHEUMATREX Take 10  mg by mouth once a week. Caution: Chemotherapy. Protect from light.   predniSONE 10 MG tablet Commonly known as: DELTASONE Take 10 mg by mouth as needed.   REMICADE IV Inject into the vein. Remicade infusion-As directed/ first infusion was 11/28/17   VITAMIN B-12 PO Take 1 tablet by mouth daily.   VITAMIN D PO Take 1 tablet by mouth daily.   Voltaren 1 % Gel Generic drug: diclofenac Sodium Apply topically as needed.         Physical Exam: Vitals:   02/19/19 1334  BP: 114/70  Pulse: 94  Temp: 99.1 F (37.3 C)  TempSrc: Temporal  SpO2: 99%  Weight: 143 lb (64.9 kg)  Height: 5' 5"  (1.651 m)   Body mass index is 23.8 kg/m. Wt Readings from Last 3 Encounters:  02/19/19 143 lb (64.9 kg)  01/28/18 126 lb (57.2 kg)  12/22/17 131 lb (59.4 kg)    Physical Exam Constitutional:      General: She is not in acute distress.    Appearance: She is well-developed. She is not diaphoretic.  HENT:     Head: Normocephalic and atraumatic.     Mouth/Throat:     Pharynx: No oropharyngeal exudate.  Eyes:     Conjunctiva/sclera: Conjunctivae normal.     Pupils: Pupils are equal, round, and reactive to light.  Neck:     Musculoskeletal: Normal range of motion and neck supple.  Cardiovascular:     Rate and Rhythm: Normal rate and regular rhythm.     Heart sounds: Normal heart sounds.  Pulmonary:     Effort: Pulmonary effort is normal.     Breath sounds: Normal breath sounds.  Chest:     Comments: By GYN Abdominal:     General: Bowel sounds are normal.     Palpations: Abdomen is soft.  Genitourinary:    Comments: By GYN Musculoskeletal:        General: No tenderness.  Skin:    General: Skin is warm and dry.  Neurological:     Mental Status: She is  alert and oriented to person, place, and time.     Labs reviewed: Basic Metabolic Panel: No results for input(s): NA, K, CL, CO2, GLUCOSE, BUN, CREATININE, CALCIUM, MG, PHOS, TSH in the last 8760 hours. Liver Function Tests: No results for input(s): AST, ALT, ALKPHOS, BILITOT, PROT, ALBUMIN in the last 8760 hours. No results for input(s): LIPASE, AMYLASE in the last 8760 hours. No results for input(s): AMMONIA in the last 8760 hours. CBC: No results for input(s): WBC, NEUTROABS, HGB, HCT, MCV, PLT in the last 8760 hours. Lipid Panel: No results for input(s): CHOL, HDL, LDLCALC, TRIG, CHOLHDL, LDLDIRECT in the last 8760 hours. No results found for: HGBA1C  Procedures: No results found.  Assessment/Plan 1. Wellness examination -she is doing well, not exercising as much and eating more due to [place]. The patient was counseled regarding the appropriate use of alcohol, regular self-examination of the breasts on a monthly basis, prevention of dental and periodontal disease, diet, regular sustained exercise for at least 30 minutes 5 times per week, routine screening interval for mammogram as recommended by the [place] and ACOG, importance of regular PAP smears, recommended schedule for GI hemoccult testing, colonoscopy, cholesterol, thyroid and diabetes screening. -following with rheumatology, GI, GYN routinely.  -information given for dentist - CMP with eGFR([facility]); Future - CBC with Differential/Platelet; Future - Lipid panel; Future  2. Hyperlipidemia, unspecified hyperlipidemia type -will follow up fasting lipid panel  3. Ulcerative colitis without complications, unspecified location ([place]) -no recent flares.   4. Reactive arthritis ([place]) -ongoing follow up with rheumatologist, overall manageable on current regimen.  Next appt: yearly with fasting lab work next week.  Carlos American. [place], [facility] [phone]

## 2019-02-19 NOTE — Patient Instructions (Addendum)
[facility] [address] [place] [id number]  [phone]     Try to get 30 mins of exercise in 5 days a week.  Follow up fasting labs next week when you are able  Follow up in 1 year for yearly follow up      Health Maintenance, Female Adopting a healthy lifestyle and getting preventive care are important in promoting health and wellness. Ask your health care provider about:  The right schedule for you to have regular tests and exams.  Things you can do on your own to prevent diseases and keep yourself healthy. What should I know about diet, weight, and exercise? Eat a healthy diet   Eat a diet that includes plenty of vegetables, fruits, low-fat dairy products, and lean protein.  Do not eat a lot of foods that are high in solid fats, added sugars, or sodium. Maintain a healthy weight Body mass index (BMI) is used to identify weight problems. It estimates body fat based on height and weight. Your health care provider can help determine your BMI and help you achieve or maintain a healthy weight. Get regular exercise Get regular exercise. This is one of the most important things you can do for your health. Most adults should:  Exercise for at least 150 minutes each week. The exercise should increase your heart rate and make you sweat (moderate-intensity exercise).  Do strengthening exercises at least twice a week. This is in addition to the moderate-intensity exercise.  Spend less time sitting. Even light physical activity can be beneficial. Watch cholesterol and blood lipids Have your blood tested for lipids and cholesterol at 43 years of age, then have this test every 5 years. Have your cholesterol levels checked more often if:  Your lipid or cholesterol levels are high.  You are older than 43 years of age.  You are at high risk for heart disease. What should I know about cancer screening? Depending on your health  history and family history, you may need to have cancer screening at various ages. This may include screening for:  Breast cancer.  Cervical cancer.  Colorectal cancer.  Skin cancer.  Lung cancer. What should I know about heart disease, diabetes, and high blood pressure? Blood pressure and heart disease  High blood pressure causes heart disease and increases the risk of stroke. This is more likely to develop in people who have high blood pressure readings, are of African descent, or are overweight.  Have your blood pressure checked: ? Every 3-5 years if you are 27-68 years of age. ? Every year if you are 76 years old or older. Diabetes Have regular diabetes screenings. This checks your fasting blood sugar level. Have the screening done:  Once every three years after age 33 if you are at a normal weight and have a low risk for diabetes.  More often and at a younger age if you are overweight or have a high risk for diabetes. What should I know about preventing infection? Hepatitis B If you have a higher risk for hepatitis B, you should be screened for this virus. Talk with your health care provider to find out if you are at risk for hepatitis B infection. Hepatitis C Testing is recommended for:  Everyone born from [birth year] through 1965.  Anyone with known risk factors for hepatitis C. Sexually transmitted infections (STIs)  Get screened for STIs, including gonorrhea and chlamydia, if: ? You are sexually active and are  younger than 43 years of age. ? You are older than 43 years of age and your health care provider tells you that you are at risk for this type of infection. ? Your sexual activity has changed since you were last screened, and you are at increased risk for chlamydia or gonorrhea. Ask your health care provider if you are at risk.  Ask your health care provider about whether you are at high risk for HIV. Your health care provider may recommend a prescription medicine to  help prevent HIV infection. If you choose to take medicine to prevent HIV, you should first get tested for HIV. You should then be tested every 3 months for as long as you are taking the medicine. Pregnancy  If you are about to stop having your period (premenopausal) and you may become pregnant, seek counseling before you get pregnant.  Take 400 to 800 micrograms (mcg) of folic acid every day if you become pregnant.  Ask for birth control (contraception) if you want to prevent pregnancy. Osteoporosis and menopause Osteoporosis is a disease in which the bones lose minerals and strength with aging. This can result in bone fractures. If you are 21 years old or older, or if you are at risk for osteoporosis and fractures, ask your health care provider if you should:  Be screened for bone loss.  Take a calcium or vitamin D supplement to lower your risk of fractures.  Be given hormone replacement therapy (HRT) to treat symptoms of menopause. Follow these instructions at home: Lifestyle  Do not use any products that contain nicotine or tobacco, such as cigarettes, e-cigarettes, and chewing tobacco. If you need help quitting, ask your health care provider.  Do not use street drugs.  Do not share needles.  Ask your health care provider for help if you need support or information about quitting drugs. Alcohol use  Do not drink alcohol if: ? Your health care provider tells you not to drink. ? You are pregnant, may be pregnant, or are planning to become pregnant.  If you drink alcohol: ? Limit how much you use to 0-1 drink a day. ? Limit intake if you are breastfeeding.  Be aware of how much alcohol is in your drink. In the [place] one drink equals one 12 oz bottle of beer (355 mL), one 5 oz glass of wine (148 mL), or one 1 oz glass of hard liquor (44 mL). General instructions  Schedule regular health, dental, and eye exams.  Stay current with your vaccines.  Tell your health care  provider if: ? You often feel depressed. ? You have ever been abused or do not feel safe at home. Summary  Adopting a healthy lifestyle and getting preventive care are important in promoting health and wellness.  Follow your health care provider's instructions about healthy diet, exercising, and getting tested or screened for diseases.  Follow your health care provider's instructions on monitoring your cholesterol and blood pressure. This information is not intended to replace advice given to you by your health care provider. Make sure you discuss any questions you have with your health care provider. Document Released: 09/17/2010 Document Revised: 02/25/2018 Document Reviewed: 02/25/2018 Elsevier Patient Education  2020 [employer].

## 2019-02-24 ENCOUNTER — Other Ambulatory Visit: Payer: Self-pay

## 2019-02-24 ENCOUNTER — Other Ambulatory Visit: Payer: 59

## 2019-02-24 DIAGNOSIS — Z0000 Encounter for general adult medical examination without abnormal findings: Secondary | ICD-10-CM

## 2019-02-25 ENCOUNTER — Encounter: Payer: Self-pay | Admitting: *Deleted

## 2019-02-25 LAB — COMPLETE METABOLIC PANEL WITH GFR
AG Ratio: 1.1 (calc) (ref 1.0–2.5)
ALT: 14 U/L (ref 6–29)
AST: 19 U/L (ref 10–30)
Albumin: 4.2 g/dL (ref 3.6–5.1)
Alkaline phosphatase (APISO): 51 U/L (ref 31–125)
BUN: 18 mg/dL (ref 7–25)
CO2: 25 mmol/L (ref 20–32)
Calcium: 9.5 mg/dL (ref 8.6–10.2)
Chloride: 106 mmol/L (ref 98–110)
Creat: 0.66 mg/dL (ref 0.50–1.10)
GFR, Est African American: 125 mL/min/{1.73_m2} (ref >=60)
GFR, Est Non African American: 108 mL/min/{1.73_m2} (ref >=60)
Globulin: 3.7 g/dL (calc) (ref 1.9–3.7)
Glucose, Bld: 97 mg/dL (ref 65–99)
Potassium: 4.2 mmol/L (ref 3.5–5.3)
Sodium: 139 mmol/L (ref 135–146)
Total Bilirubin: 0.4 mg/dL (ref 0.2–1.2)
Total Protein: 7.9 g/dL (ref 6.1–8.1)

## 2019-02-25 LAB — CBC WITH DIFFERENTIAL/PLATELET
Absolute Monocytes: 168 cells/uL — ABNORMAL LOW (ref 200–950)
Basophils Absolute: 9 cells/uL (ref 0–200)
Basophils Relative: 0.2 %
Eosinophils Absolute: 181 cells/uL (ref 15–500)
Eosinophils Relative: 4.2 %
HCT: 37.5 % (ref 35.0–45.0)
Hemoglobin: 11.9 g/dL (ref 11.7–15.5)
Lymphs Abs: 1135 cells/uL (ref 850–3900)
MCH: 27.8 pg (ref 27.0–33.0)
MCHC: 31.7 g/dL — ABNORMAL LOW (ref 32.0–36.0)
MCV: 87.6 fL (ref 80.0–100.0)
MPV: 11.5 fL (ref 7.5–12.5)
Monocytes Relative: 3.9 %
Neutro Abs: 2808 cells/uL (ref 1500–7800)
Neutrophils Relative %: 65.3 %
Platelets: 326 10*3/uL (ref 140–400)
RBC: 4.28 10*6/uL (ref 3.80–5.10)
RDW: 14.3 % (ref 11.0–15.0)
Total Lymphocyte: 26.4 %
WBC: 4.3 10*3/uL (ref 3.8–10.8)

## 2019-02-25 LAB — LIPID PANEL
Cholesterol: 183 mg/dL (ref <200)
HDL: 55 mg/dL (ref >=50)
LDL Cholesterol (Calc): 109 mg/dL (calc) — ABNORMAL HIGH
Non-HDL Cholesterol (Calc): 128 mg/dL (calc) (ref <130)
Total CHOL/HDL Ratio: 3.3 (calc) (ref <5.0)
Triglycerides: 95 mg/dL (ref <150)

## 2019-06-22 DIAGNOSIS — M459 Ankylosing spondylitis of unspecified sites in spine: Secondary | ICD-10-CM | POA: Insufficient documentation

## 2019-07-16 ENCOUNTER — Ambulatory Visit (INDEPENDENT_AMBULATORY_CARE_PROVIDER_SITE_OTHER): Payer: 59 | Admitting: Nurse Practitioner

## 2019-07-16 ENCOUNTER — Encounter: Payer: Self-pay | Admitting: Nurse Practitioner

## 2019-07-16 ENCOUNTER — Other Ambulatory Visit: Payer: Self-pay

## 2019-07-16 VITALS — BP 122/82 | HR 96 | Temp 97.9°F | Ht 65.0 in | Wt 136.0 lb

## 2019-07-16 DIAGNOSIS — L309 Dermatitis, unspecified: Secondary | ICD-10-CM | POA: Diagnosis not present

## 2019-07-16 DIAGNOSIS — M069 Rheumatoid arthritis, unspecified: Secondary | ICD-10-CM

## 2019-07-16 DIAGNOSIS — F411 Generalized anxiety disorder: Secondary | ICD-10-CM | POA: Diagnosis not present

## 2019-07-16 MED ORDER — CLOBETASOL PROPIONATE 0.05 % EX CREA: 1.0000 "application " | TOPICAL_CREAM | Freq: Two times a day (BID) | CUTANEOUS | 0 refills | Status: DC

## 2019-07-16 NOTE — Progress Notes (Signed)
Careteam: Patient Care Team: Lauree Chandler, NP as PCP - General (Nurse Practitioner)  PLACE OF SERVICE:  [place]  Advanced Directive information    Allergies  Allergen Reactions  . Iodine     Rash on face, coughing,    Chief Complaint  Patient presents with  . Acute Visit    Pain management  for arthritis pain      HPI: Patient is a 44 y.o. female presenting to office for pain management options. She does not want to take pills, she wants options for alternative methods to decrease pain due to her chronic RA. She has looked into pain management clinics but they do not seem like the right options for her. Pain is not any worse but she does not even want to take Tylenol because she is taking methotrexate and Inflectra infusions every eight weeks. She just wants to feel better and optimize her life. Pt asked about physical therapy and acupuncture options. Pt also saw Dr Nevada Crane- dermatologist for a skin rash, was prescribed Clobetasol 0.05%. It helped but is out of cream. Pt is having allergies but is taking Zytrec daily which is keeping this under control.   Review of Systems:  Review of Systems  Musculoskeletal: Positive for joint pain (left wrist, knee, foot) and myalgias (left wrist, knee, and foot).  Skin: Positive for itching and rash (abdomen).  Endo/Heme/Allergies: Positive for environmental allergies.    Past Medical History:  Diagnosis Date  . ANA positive   . Arthritis 2012   reactive arthritis  . Foot pain   . Hand pain   . High risk medication use   . History of kidney disease as a child   . Osteoarthritis    c spine  . Raynaud's disease   . Sacroiliitis ([place])   . Sicca complex ([place])   . Sjogren's syndrome ([place])   . Spondyloarthritis   . Ulcerative colitis [facility])    Past Surgical History:  Procedure Laterality Date  . COLONOSCOPY    . TUBAL LIGATION     Social History:   reports that she has never smoked. She has never used smokeless tobacco.  She reports current alcohol use. She reports that she does not use drugs.  Family History  Problem Relation Age of Onset  . Cancer Mother 52       Breast  . Hypertension Mother   . Cancer Father 67       bone  . Ulcerative colitis Brother 67  . Diabetes Brother   . Gallbladder disease Daughter   . Diabetes Maternal Grandmother   . Stomach cancer Maternal Grandmother   . Colon cancer Neg Hx   . Colon polyps Neg Hx   . Esophageal cancer Neg Hx   . Rectal cancer Neg Hx     Medications: Patient's Medications  New Prescriptions   No medications on file  Previous Medications   ACETAMINOPHEN (TYLENOL) 500 MG TABLET    Take 1,000 mg by mouth every 12 (twelve) hours as needed.   BIOTIN (BIOTIN MAXIMUM STRENGTH) 10 MG TABS    Take 10 mg by mouth daily.   CETIRIZINE (ZYRTEC) 10 MG TABLET    Take 10 mg by mouth as needed.    CHOLECALCIFEROL (VITAMIN D PO)    Take 1 tablet by mouth daily.   CYANOCOBALAMIN (VITAMIN B-12 PO)    Take 1 tablet by mouth daily.   DICLOFENAC SODIUM (VOLTAREN) 1 % GEL    Apply topically as  needed.   FOLIC ACID (FOLVITE) 1 MG TABLET    Take 1 mg by mouth daily.   IBUPROFEN (ADVIL,MOTRIN) 200 MG TABLET    Take 200 mg by mouth as needed.   INFLIXIMAB-DYYB (INFLECTRA IV)    Inject into the vein.   L-THEANINE PO    Take by mouth at bedtime.   MELATONIN PO    Take 2 mg by mouth daily.   METHOTREXATE (RHEUMATREX) 10 MG TABLET    Take 10 mg by mouth once a week. Caution: Chemotherapy. Protect from light.   PREDNISONE (DELTASONE) 10 MG TABLET    Take 10 mg by mouth as needed.  Modified Medications   No medications on file  Discontinued Medications   ACETAMINOPHEN (TYLENOL) 325 MG CAPS    Tylenol  prn   IBUPROFEN (ADVIL) 800 MG TABLET    Advil  prn   INFLIXIMAB (REMICADE IV)    Inject into the vein. Remicade infusion-As directed/ first infusion was 11/28/17   LEUCOVORIN (WELLCOVORIN) 5 MG TABLET    Take 5 mg by mouth once a week.    Physical Exam:  Vitals:    07/16/19 1141  BP: 122/82  Pulse: 96  Temp: 97.9 F (36.6 C)  SpO2: 99%  Weight: 136 lb (61.7 kg)  Height: 5' 5"  (1.651 m)   Body mass index is 22.63 kg/m. Wt Readings from Last 3 Encounters:  07/16/19 136 lb (61.7 kg)  02/19/19 143 lb (64.9 kg)  01/28/18 126 lb (57.2 kg)    Physical Exam Vitals and nursing note reviewed.  Constitutional:      Appearance: Normal appearance.  Cardiovascular:     Rate and Rhythm: Normal rate and regular rhythm.     Pulses: Normal pulses.     Heart sounds: Normal heart sounds.  Pulmonary:     Effort: Pulmonary effort is normal.     Breath sounds: Normal breath sounds.  Musculoskeletal:     Right wrist: No tenderness.     Left wrist: No tenderness.     Right knee: No swelling. No tenderness.     Left knee: No swelling. No tenderness.     Right lower leg: No edema.     Left lower leg: No edema.  Skin:    General: Skin is warm and dry.  Neurological:     Mental Status: She is alert.  Psychiatric:        Attention and Perception: Attention and perception normal.        Mood and Affect: Mood is anxious.        Behavior: Behavior normal. Behavior is cooperative.    Labs reviewed: Basic Metabolic Panel: Recent Labs    02/24/19 0922  NA 139  K 4.2  CL 106  CO2 25  GLUCOSE 97  BUN 18  CREATININE 0.66  CALCIUM 9.5   Liver Function Tests: Recent Labs    02/24/19 0922  AST 19  ALT 14  BILITOT 0.4  PROT 7.9   No results for input(s): LIPASE, AMYLASE in the last 8760 hours. No results for input(s): AMMONIA in the last 8760 hours. CBC: Recent Labs    02/24/19 0922  WBC 4.3  NEUTROABS 2,808  HGB 11.9  HCT 37.5  MCV 87.6  PLT 326   Lipid Panel: Recent Labs    02/24/19 0922  CHOL 183  HDL 55  LDLCALC 109*  TRIG 95  CHOLHDL 3.3   TSH: No results for input(s): TSH in the last 8760 hours. A1C:  No results found for: HGBA1C   Assessment/Plan 1. Anxiety state -Pt does think some of her pain is from anxiety. She  wants try physical therapy and acupuncture before beginning a new medication for anxiety. -Offered Cymbalta for anxiety and pain benefits. Pt will think about this and get back with office. She also plans to start therapy to help with lifestyle modifications to reduce stress and help manage anxiety.   2. Rheumatoid arthritis involving multiple sites, unspecified whether rheumatoid factor present ([place]) -Pt is wanting other options for pain manage ment than pills. She is going to explore PT and acupuncture. -Lifestyle and dietary modifications - Ambulatory referral to Physical Therapy  3. Eczema, unspecified type - Pt is taking corticosteroid cream from Dermatologist for itchy, dry areas on her abdomen which has been beneficial. -Continue Clobetasol 0.05% cream twice daily as needed  Nikol Lemar K. [place], [facility] [phone]   I personally was present during the history, physical exam and medical decision-making activities of this service and have verified that the service and findings are accurately documented in the student's note

## 2019-07-16 NOTE — Progress Notes (Deleted)
Careteam: Patient Care Team: Lauree Chandler, NP as PCP - General (Nurse Practitioner)  PLACE OF SERVICE:  [place]  Advanced Directive information    Allergies  Allergen Reactions  . Iodine     Rash on face, coughing,    Chief Complaint  Patient presents with  . Acute Visit    Pain management  for arthritis pain      HPI: Patient is a 44 y.o. female ***  Review of Systems:  ROS***  Past Medical History:  Diagnosis Date  . ANA positive   . Arthritis 2012   reactive arthritis  . Foot pain   . Hand pain   . High risk medication use   . History of kidney disease as a child   . Osteoarthritis    c spine  . Raynaud's disease   . Sacroiliitis ([place])   . Sicca complex ([place])   . Sjogren's syndrome ([place])   . Spondyloarthritis   . Ulcerative colitis [facility])    Past Surgical History:  Procedure Laterality Date  . COLONOSCOPY    . TUBAL LIGATION     Social History:   reports that she has never smoked. She has never used smokeless tobacco. She reports current alcohol use. She reports that she does not use drugs.  Family History  Problem Relation Age of Onset  . Cancer Mother 66       Breast  . Hypertension Mother   . Cancer Father 4       bone  . Ulcerative colitis Brother 68  . Diabetes Brother   . Gallbladder disease Daughter   . Diabetes Maternal Grandmother   . Stomach cancer Maternal Grandmother   . Colon cancer Neg Hx   . Colon polyps Neg Hx   . Esophageal cancer Neg Hx   . Rectal cancer Neg Hx     Medications: Patient's Medications  New Prescriptions   No medications on file  Previous Medications   ACETAMINOPHEN (TYLENOL) 500 MG TABLET    Take 1,000 mg by mouth every 12 (twelve) hours as needed.   BIOTIN (BIOTIN MAXIMUM STRENGTH) 10 MG TABS    Take 10 mg by mouth daily.   CETIRIZINE (ZYRTEC) 10 MG TABLET    Take 10 mg by mouth as needed.    CHOLECALCIFEROL (VITAMIN D PO)    Take 1 tablet by mouth daily.   CYANOCOBALAMIN (VITAMIN B-12  PO)    Take 1 tablet by mouth daily.   DICLOFENAC SODIUM (VOLTAREN) 1 % GEL    Apply topically as needed.   FOLIC ACID (FOLVITE) 1 MG TABLET    Take 1 mg by mouth daily.   IBUPROFEN (ADVIL,MOTRIN) 200 MG TABLET    Take 200 mg by mouth as needed.   INFLIXIMAB-DYYB (INFLECTRA IV)    Inject into the vein.   L-THEANINE PO    Take by mouth at bedtime.   MELATONIN PO    Take 2 mg by mouth daily.   METHOTREXATE (RHEUMATREX) 10 MG TABLET    Take 10 mg by mouth once a week. Caution: Chemotherapy. Protect from light.   PREDNISONE (DELTASONE) 10 MG TABLET    Take 10 mg by mouth as needed.  Modified Medications   No medications on file  Discontinued Medications   ACETAMINOPHEN (TYLENOL) 325 MG CAPS    Tylenol  prn   IBUPROFEN (ADVIL) 800 MG TABLET    Advil  prn   INFLIXIMAB (REMICADE IV)    Inject into  the vein. Remicade infusion-As directed/ first infusion was 11/28/17   LEUCOVORIN (WELLCOVORIN) 5 MG TABLET    Take 5 mg by mouth once a week.    Physical Exam:  Vitals:   07/16/19 1141  BP: 122/82  Pulse: 96  Temp: 97.9 F (36.6 C)  SpO2: 99%  Weight: 136 lb (61.7 kg)  Height: 5' 5"  (1.651 m)   Body mass index is 22.63 kg/m. Wt Readings from Last 3 Encounters:  07/16/19 136 lb (61.7 kg)  02/19/19 143 lb (64.9 kg)  01/28/18 126 lb (57.2 kg)    Physical Exam***  Labs reviewed: Basic Metabolic Panel: Recent Labs    02/24/19 0922  NA 139  K 4.2  CL 106  CO2 25  GLUCOSE 97  BUN 18  CREATININE 0.66  CALCIUM 9.5   Liver Function Tests: Recent Labs    02/24/19 0922  AST 19  ALT 14  BILITOT 0.4  PROT 7.9   No results for input(s): LIPASE, AMYLASE in the last 8760 hours. No results for input(s): AMMONIA in the last 8760 hours. CBC: Recent Labs    02/24/19 0922  WBC 4.3  NEUTROABS 2,808  HGB 11.9  HCT 37.5  MCV 87.6  PLT 326   Lipid Panel: Recent Labs    02/24/19 0922  CHOL 183  HDL 55  LDLCALC 109*  TRIG 95  CHOLHDL 3.3   TSH: No results for input(s):  TSH in the last 8760 hours. A1C: No results found for: HGBA1C   Assessment/Plan There are no diagnoses linked to this encounter.  Next appt: *** Zoeann Mol K. [place], [facility] [phone]

## 2019-08-10 ENCOUNTER — Ambulatory Visit: Payer: 59 | Attending: Nurse Practitioner | Admitting: Physical Therapy

## 2019-08-10 ENCOUNTER — Other Ambulatory Visit: Payer: Self-pay

## 2019-08-10 DIAGNOSIS — M5440 Lumbago with sciatica, unspecified side: Secondary | ICD-10-CM | POA: Diagnosis not present

## 2019-08-10 DIAGNOSIS — M25562 Pain in left knee: Secondary | ICD-10-CM | POA: Diagnosis present

## 2019-08-10 DIAGNOSIS — M6281 Muscle weakness (generalized): Secondary | ICD-10-CM

## 2019-08-10 DIAGNOSIS — M25561 Pain in right knee: Secondary | ICD-10-CM | POA: Insufficient documentation

## 2019-08-10 DIAGNOSIS — G8929 Other chronic pain: Secondary | ICD-10-CM | POA: Insufficient documentation

## 2019-08-10 DIAGNOSIS — R293 Abnormal posture: Secondary | ICD-10-CM | POA: Insufficient documentation

## 2019-08-10 NOTE — Patient Instructions (Addendum)
Sleeping on Back  Place pillow under knees. A pillow with cervical support and a roll around waist are also helpful. Copyright  VHI. All rights reserved.  Sleeping on Side Place pillow between knees. Use cervical support under neck and a roll around waist as needed. Copyright  VHI. All rights reserved.   Sleeping on Stomach   If this is the only desirable sleeping position, place pillow under lower legs, and under stomach or chest as needed.  Posture - Sitting   Sit upright, head facing forward. Try using a roll to support lower back. Keep shoulders relaxed, and avoid rounded back. Keep hips level with knees. Avoid crossing legs for long periods. Stand to Sit / Sit to Stand   To sit: Bend knees to lower self onto front edge of chair, then scoot back on seat. To stand: Reverse sequence by placing one foot forward, and scoot to front of seat. Use rocking motion to stand up.   Work Height and Reach  Ideal work height is no more than 2 to 4 inches below elbow level when standing, and at elbow level when sitting. Reaching should be limited to arm's length, with elbows slightly bent.  Bending  Bend at hips and knees, not back. Keep feet shoulder-width apart.    Posture - Standing   Good posture is important. Avoid slouching and forward head thrust. Maintain curve in low back and align ears over shoul- ders, hips over ankles.  Alternating Positions   Alternate tasks and change positions frequently to reduce fatigue and muscle tension. Take rest breaks. Computer Work   Position work to Programmer, multimedia. Use proper work and seat height. Keep shoulders back and down, wrists straight, and elbows at right angles. Use chair that provides full back support. Add footrest and lumbar roll as needed.  Getting Into / Out of Car  Lower self onto seat, scoot back, then bring in one leg at a time. Reverse sequence to get out.  Dressing  Lie on back to pull socks or slacks over feet, or sit  and bend leg while keeping back straight.    Housework - Sink  Place one foot on ledge of cabinet under sink when standing at sink for prolonged periods.   Pushing / Pulling  Pushing is preferable to pulling. Keep back in proper alignment, and use leg muscles to do the work.  Deep Squat   Squat and lift with both arms held against upper trunk. Tighten stomach muscles without holding breath. Use smooth movements to avoid jerking.  Avoid Twisting   Avoid twisting or bending back. Pivot around using foot movements, and bend at knees if needed when reaching for articles.  Carrying Luggage   Distribute weight evenly on both sides. Use a cart whenever possible. Do not twist trunk. Move body as a unit.   Lifting Principles .Maintain proper posture and head alignment. .Slide object as close as possible before lifting. .Move obstacles out of the way. .Test before lifting; ask for help if too heavy. .Tighten stomach muscles without holding breath. .Use smooth movements; do not jerk. .Use legs to do the work, and pivot with feet. .Distribute the work load symmetrically and close to the center of trunk. .Push instead of pull whenever possible.   Ask For Help   Ask for help and delegate to others when possible. Coordinate your movements when lifting together, and maintain the low back curve.  Log Roll   Lying on back, bend left knee and place left  arm across chest. Roll all in one movement to the right. Reverse to roll to the left. Always move as one unit. Housework - Sweeping  Use long-handled equipment to avoid stooping.   Housework - Wiping  Position yourself as close as possible to reach work surface. Avoid straining your back.  Laundry - Unloading Wash   To unload small items at bottom of washer, lift leg opposite to arm being used to reach.  [place] close to area to be raked. Use arm movements to do the work. Keep back straight and avoid  twisting.     Cart  When reaching into cart with one arm, lift opposite leg to keep back straight.   Getting Into / Out of Bed  Lower self to lie down on one side by raising legs and lowering head at the same time. Use arms to assist moving without twisting. Bend both knees to roll onto back if desired. To sit up, start from lying on side, and use same move-ments in reverse. Housework - Vacuuming  Hold the vacuum with arm held at side. Step back and forth to move it, keeping head up. Avoid twisting.   Laundry - IT consultant so that bending and twisting can be avoided.   Laundry - Unloading Dryer  Squat down to reach into clothes dryer or use a reacher.  Gardening - Weeding / Probation officer or Kneel. Knee pads may be helpful.                   Trigger Point Dry Needling  . What is Trigger Point Dry Needling (DN)? o DN is a physical therapy technique used to treat muscle pain and dysfunction. Specifically, DN helps deactivate muscle trigger points (muscle knots).  o A thin filiform needle is used to penetrate the skin and stimulate the underlying trigger point. The goal is for a local twitch response (LTR) to occur and for the trigger point to relax. No medication of any kind is injected during the procedure.   . What Does Trigger Point Dry Needling Feel Like?  o The procedure feels different for each individual patient. Some patients report that they do not actually feel the needle enter the skin and overall the process is not painful. Very mild bleeding may occur. However, many patients feel a deep cramping in the muscle in which the needle was inserted. This is the local twitch response.   Marland Kitchen How Will I feel after the treatment? o Soreness is normal, and the onset of soreness may not occur for a few hours. Typically this soreness does not last longer than two days.  o Bruising is uncommon, however; ice can be used to decrease any  possible bruising.  o In rare cases feeling tired or nauseous after the treatment is normal. In addition, your symptoms may get worse before they get better, this period will typically not last longer than 24 hours.   . What Can I do After My Treatment? o Increase your hydration by drinking more water for the next 24 hours. o You may place ice or heat on the areas treated that have become sore, however, do not use heat on inflamed or bruised areas. Heat often brings more relief post needling. o You can continue your regular activities, but vigorous activity is not recommended initially after the treatment for 24 hours. o DN is best combined with other physical therapy such as strengthening, stretching, and other  therapies.    Voncille Lo, PT Certified Exercise Expert for the Aging Adult  08/10/19 1:50 PM Phone: [phone] Fax: [phone]

## 2019-08-10 NOTE — Therapy (Signed)
[place], [place], [address] Phone: [phone]   Fax:  [phone]  Physical Therapy Evaluation  Patient Details  Name: Laura Finley MRN: [id number] Date of Birth: Feb 01, 1976 Referring Provider (PT): Sherrie Mustache NP   Encounter Date: 08/10/2019  PT End of Session - 08/10/19 1642    Visit Number  1    Number of Visits  9    Date for PT Re-Evaluation  10/05/19    Authorization Type  [employer] A[employer]    PT Start Time  1330    PT Stop Time  1415    PT Time Calculation (min)  45 min    Activity Tolerance  Patient tolerated treatment well    Behavior During Therapy  [facility] for tasks assessed/performed       Past Medical History:  Diagnosis Date  . ANA positive   . Arthritis 2012   reactive arthritis  . Foot pain   . Hand pain   . High risk medication use   . History of kidney disease as a child   . Osteoarthritis    c spine  . Raynaud's disease   . Sacroiliitis ([place])   . Sicca complex ([place])   . Sjogren's syndrome ([place])   . Spondyloarthritis   . Ulcerative colitis [facility])     Past Surgical History:  Procedure Laterality Date  . COLONOSCOPY    . TUBAL LIGATION      There were no vitals filed for this visit.   Subjective Assessment - 08/10/19 1340    Subjective  I have been going for testing and seeing a rheumatologist.  I think I have ankylosing spondylitis because of my tailbone pain.  I also have bil knee pain so I might have reactive arthritis but I am not really sure yet.. I do know that I have pain low back and knees and bil hands.  I had an infustion of  Inflectra( TNF biologic blocker) I want to be able to control my pain without using extra medicine    Pertinent History  Sjogrens syndrom,  Sacroillitis, OA, spondyloarthritis,Ulcerative colitis    How long can you sit comfortably?  30 minutes    How long can you stand comfortably?  30 minutes but when in a flare not more than 5  minutes    How long can you walk comfortably?  I can walk 3 miles but in a flare unsure    Diagnostic tests  xray    Patient Stated Goals  My goal when I have a flare,I need a HEP  What can I do to help me instead of taking motrin/aleve I already use ice and heat    Currently in Pain?  Yes    Pain Score  6     Pain Location  Back    Pain Orientation  Right;Left    Pain Descriptors / Indicators  Aching;Dull    Pain Radiating Towards  radiates into my left hip    Pain Onset  More than a month ago    Pain Frequency  Intermittent    Aggravating Factors   being seated for a long time, lifting items,    Pain Relieving Factors  heat.ice medication    Multiple Pain Sites  Yes    Pain Location  Knee    Pain Orientation  Right;Left    Pain Descriptors / Indicators  Aching;Pressure;Sharp    Pain Type  Chronic pain    Pain Onset  More  than a month ago    Pain Frequency  Intermittent    Aggravating Factors   getting out of bed in the morning, stiff, rising from the toilet         [facility] PT Assessment - 08/10/19 0001      Assessment   Medical Diagnosis  RA(ankylosing spondylitis?) bil knee pain and LBP    Referring Provider (PT)  Sherrie Mustache NP    Onset Date/Surgical Date  08/09/16   DX with reactive before this time but Now AS   Hand Dominance  Right    Next MD Visit  not scheduled    Prior Therapy  None      Precautions   Precautions  None      Restrictions   Weight Bearing Restrictions  No      Balance Screen   Has the patient fallen in the past 6 months  No    Has the patient had a decrease in activity level because of a fear of falling?   No    Is the patient reluctant to leave their home because of a fear of falling?   No      Home Environment   Living Environment  Private residence    Living Arrangements  Spouse/significant other;Children    Type of [place] to enter    Entrance Stairs-Number of Steps  1    Entrance Stairs-Rails  None     Home Layout  Two level      Prior Function   Level of Independence  Independent      Observation/Other Assessments   Focus on Therapeutic Outcomes (FOTO)   FOTO - no specific body part        Functional Tests   Functional tests  Squat;Lunges;Sit to Stand      Squat   Comments  Pt unable to lunge to ll to fground due to knee pain bil      Lunges   Comments  able to lunge forward,  Pain in knees indepnedent with out UE support but wobbles when rising to stand      Sit to Stand   Comments  5 x STS 12.46 sec      ROM / Strength   AROM / PROM / Strength  AROM;Strength      AROM   Overall AROM   Deficits    Right Knee Extension  0    Right Knee Flexion  117   ERP   Left Knee Extension  0    Left Knee Flexion  119   ERP   Lumbar Flexion  75% available   finger tips to ankles   Lumbar Extension  50%   pain in low back SI   Lumbar - Right Side Bend  75% available    Lumbar - Left Side Bend  75% available    Lumbar - Right Rotation  75% available   ERP   Lumbar - Left Rotation  75% available   ERP     Strength   Overall Strength  Deficits    Overall Strength Comments  Pt limited by inflammatory arthritic pain    Right Hip Flexion  4+/5    Right Hip Extension  4/5    Right Hip ABduction  4/5    Left Hip Flexion  4+/5    Left Hip Extension  4/5    Left Hip ABduction  4/5    Right Knee  Flexion  4/5    Right Knee Extension  4+/5    Left Knee Flexion  4/5    Left Knee Extension  4+/5      Palpation   Spinal mobility  TTP with palpation and PA mobs gentle over lumbar/SI    SI assessment   tenderness over SI jt lines (inflammatory arthritis)    Palpation comment  TTP over gluteals especially  RT gluteal, and over SI jt line/ lumbar paraspinals                  Objective measurements completed on examination: See above findings.      [facility] Adult PT Treatment/Exercise - 08/10/19 0001      Self-Care   Self-Care  ADL's;Lifting;Posture    ADL's  initial  education of ADL/body mechanics with handout    Lifting  handout with principles    Posture  initial posture standing and sitting       Trigger Point Dry Needling - 08/10/19 0001    Consent Given?  Yes    Education Handout Provided  Yes    Muscles Treated Back/Hip  Gluteus maximus;Erector spinae    Gluteus Maximus Response  Twitch response elicited;Palpable increased muscle length   attachments over SI jt line   Erector spinae Response  Twitch response elicited;Palpable increased muscle length   L5 S1 bil          PT Education - 08/10/19 1416    Education Details  POC  DN explanation and ADL/Body mechanics handouts, TPDN after care and precautians    Person(s) Educated  Patient    Methods  Explanation;Demonstration;Handout    Comprehension  Verbalized understanding;Returned demonstration       PT Short Term Goals - 08/10/19 1642      PT SHORT TERM GOAL #1   Title  Independent with initial HEP for knees and back for stretching    Time  3    Period  Weeks    Status  New    Target Date  08/31/19      PT SHORT TERM GOAL #2   Title  Demonstrate understanding of neutral posture and be more conscious of position and posture throughout the day.    Baseline  Pt with ant tilt and increased lordosis    Time  3    Period  Weeks    Status  New    Target Date  08/31/19      PT SHORT TERM GOAL #3   Title  Pt will be introduced to sleep hygiene and review positions/pain management for more comfortable sleep    Time  3    Period  Weeks    Status  New    Target Date  08/31/19        PT Long Term Goals - 08/10/19 1650      PT LONG TERM GOAL #1   Title  Demonstrate and verbalize techniques to reduce the risk of injury including: lifting, posture, body mechanics and to maintain good posture/movement during inflammatory flares    Time  8    Period  Weeks    Status  New    Target Date  10/05/19      PT LONG TERM GOAL #2   Title  Decrease 5 x STS to 9 sec or less to show  improvement with LE strength and decreased pain by 50%    Baseline  Eval 12.46 and painful rising from chair    Time  8    Period  Weeks    Status  New    Target Date  10/05/19      PT LONG TERM GOAL #3   Title  Pt will be able to lift at least 50 lb in order demonstrate proper technique and to return to strengthening in gym    Baseline  Pt not lifting due to pain from flare    Time  8    Period  Weeks    Status  New    Target Date  10/05/19      PT LONG TERM GOAL #4   Title  Pt will be educated on community wellness opportunities and organizations for knowledge of her condition    Baseline  no knowledge    Time  8    Period  Weeks    Status  New    Target Date  10/05/19      PT LONG TERM GOAL #5   Title  Pt will tolerate sitting for 1 hour in order to watch TV show or to ride in care with 75% decreased pain    Baseline  30 minutes tolerance to sitting    Time  8    Period  Weeks    Status  New    Target Date  10/05/19             Plan - 08/10/19 1439    Clinical Impression Statement  44 yo female with history of reactive arthritis > 3 years ago and presently being dx with Ankylosing Spondylitis(per pt report). Pt wants to have PT in order for her to be able to control pain and decrease dependence on over the counter oral medication.  She is on a TNF blocker by infusion and still has residual pain.  Laura Finley is unable to squat fully due to bil knee pain and has trouble sitting for longer than 30 minutes without pain.  she describes her pain as more intense in the morning but she also states she feels like she is loosing strength since her Dr in [place] told her to stop running and she deadlifts without weights.  Pt will benefit from skilled PT to recieve education on body mechanics and safe HEP to help strengthen and control pain.  Pt consented to TPDN and was closely monitored.  Pt reported she had immedate decrease in pain with TPDN and was encouraged that  she would be able to learn movement and exericises to enhance her strength and decreased pain.  Will benefit from skilled PT for 1 x a week for 8 weeks.    Personal Factors and Comorbidities  Comorbidity 1;Comorbidity 2    Comorbidities  Sjogrens syndrom,  Sacroillitis, OA, spondyloarthritis,Ulcerative colitis    Stability/Clinical Decision Making  Evolving/Moderate complexity    Clinical Decision Making  Moderate    Rehab Potential  Good    PT Frequency  1x / week    PT Duration  8 weeks    PT Treatment/Interventions  Cryotherapy;Electrical Stimulation;Iontophoresis 39m/ml Dexamethasone;Traction;Moist Heat;Therapeutic exercise;Therapeutic activities;Functional mobility training;Neuromuscular re-education;Patient/family education;Passive range of motion;Manual techniques;Dry needling;Energy conservation;Taping;Joint Manipulations;Spinal Manipulations    PT Next Visit Plan  Please check SI levels.  Pt with Ankylosing spondylitis , responded well to needling.  Has knee pain and needs Knee exericises, Treat like RA pt with knees. She has been unable to lie on stomach and needs to extend for back    PT Home Exercise Plan  handout for posture and body mechanincs/lifting  Consulted and Agree with Plan of Care  Patient       Patient will benefit from skilled therapeutic intervention in order to improve the following deficits and impairments:  Decreased mobility, Decreased range of motion, Decreased strength, Difficulty walking, Hypomobility, Impaired flexibility, Increased muscle spasms, Improper body mechanics, Postural dysfunction, Pain  Visit Diagnosis: Low back pain with sciatica, sciatica laterality unspecified, unspecified back pain laterality, unspecified chronicity  Chronic pain of left knee  Chronic pain of right knee  Muscle weakness (generalized)  Abnormal posture     Problem List Patient Active Problem List   Diagnosis Date Noted  . Wellness examination 09/11/2015  . Breast  cancer screening 06/05/2014  . Anxiety state 06/05/2014  . Hyperlipidemia 06/05/2014  . Arthritis   . Ulcerative colitis ([place])   . Sjogren's syndrome ([place])   . History of kidney disease as a child     Voncille Lo, PT Certified Exercise Expert for the Aging Adult  08/10/19 5:07 PM Phone: [phone] Fax: [facility] [address] [place], [place], [address] Phone: [phone]   Fax:  [phone]  Name: Laura Finley MRN: [id number] Date of Birth: September 13, 1975

## 2019-08-23 ENCOUNTER — Ambulatory Visit: Payer: 59 | Attending: Nurse Practitioner | Admitting: Physical Therapy

## 2019-08-23 ENCOUNTER — Encounter: Payer: Self-pay | Admitting: Physical Therapy

## 2019-08-23 ENCOUNTER — Other Ambulatory Visit: Payer: Self-pay

## 2019-08-23 DIAGNOSIS — M6281 Muscle weakness (generalized): Secondary | ICD-10-CM | POA: Insufficient documentation

## 2019-08-23 DIAGNOSIS — R293 Abnormal posture: Secondary | ICD-10-CM | POA: Diagnosis present

## 2019-08-23 DIAGNOSIS — M25561 Pain in right knee: Secondary | ICD-10-CM | POA: Diagnosis present

## 2019-08-23 DIAGNOSIS — G8929 Other chronic pain: Secondary | ICD-10-CM | POA: Insufficient documentation

## 2019-08-23 DIAGNOSIS — M5440 Lumbago with sciatica, unspecified side: Secondary | ICD-10-CM

## 2019-08-23 DIAGNOSIS — M25562 Pain in left knee: Secondary | ICD-10-CM | POA: Diagnosis present

## 2019-08-23 NOTE — Therapy (Signed)
[place], [place], [address] Phone: [phone]   Fax:  [phone]  Physical Therapy Treatment  Patient Details  Name: Laura Finley MRN: [id number] Date of Birth: 07-Jun-1975 Referring Provider (PT): Sherrie Mustache NP   Encounter Date: 08/23/2019  PT End of Session - 08/23/19 1424    Visit Number  2    Number of Visits  9    Date for PT Re-Evaluation  10/05/19    Authorization Type  [employer] ACO    PT Start Time  1420    PT Stop Time  1500    PT Time Calculation (min)  40 min    Activity Tolerance  Patient tolerated treatment well    Behavior During Therapy  [facility] for tasks assessed/performed       Past Medical History:  Diagnosis Date  . ANA positive   . Arthritis 2012   reactive arthritis  . Foot pain   . Hand pain   . High risk medication use   . History of kidney disease as a child   . Osteoarthritis    c spine  . Raynaud's disease   . Sacroiliitis ([place])   . Sicca complex ([place])   . Sjogren's syndrome ([place])   . Spondyloarthritis   . Ulcerative colitis [facility])     Past Surgical History:  Procedure Laterality Date  . COLONOSCOPY    . TUBAL LIGATION      There were no vitals filed for this visit.  Subjective Assessment - 08/23/19 1422    Subjective  DN went really well. I was sore but I did not feel the same pressure.    Patient Stated Goals  My goal when I have a flare,I need a HEP  What can I do to help me instead of taking motrin/aleve I already use ice and heat    Currently in Pain?  Yes    Pain Score  3     Pain Location  Back    Pain Orientation  Left;Lower    Pain Descriptors / Indicators  Pressure                        [facility] Adult PT Treatment/Exercise - 08/23/19 0001      Therapeutic Activites    Therapeutic Activities  Other Therapeutic Activities    Other Therapeutic Activities  seated posture      Exercises   Exercises  Knee/Hip       Knee/Hip Exercises: Stretches   Active Hamstring Stretch  Both;5 reps    Active Hamstring Stretch Limitations  2 sets    Passive Hamstring Stretch Limitations  seated EOB    Piriformis Stretch Limitations  hooklying pull across, also seated      Knee/Hip Exercises: Supine   Other Supine Knee/Hip Exercises  transv abdominis engagment- supine & seated      Manual Therapy   Manual Therapy  Joint mobilization;Soft tissue mobilization    Manual therapy comments  skilled palpation and monitoring during TPDN    Joint Mobilization  Lt SIJ    Soft tissue mobilization  LT gluts, piriformis       Trigger Point Dry Needling - 08/23/19 0001    Muscles Treated Back/Hip  Piriformis    Gluteus Maximus Response  Twitch response elicited;Palpable increased muscle length   Left   Piriformis Response  Twitch response elicited;Palpable increased muscle length   LEft  PT Education - 08/23/19 1502    Education Details  lower impact exercise, posture    Person(s) Educated  Patient    Methods  Explanation;Handout    Comprehension  Verbalized understanding;Need further instruction       PT Short Term Goals - 08/10/19 1642      PT SHORT TERM GOAL #1   Title  Independent with initial HEP for knees and back for stretching    Time  3    Period  Weeks    Status  New    Target Date  08/31/19      PT SHORT TERM GOAL #2   Title  Demonstrate understanding of neutral posture and be more conscious of position and posture throughout the day.    Baseline  Pt with ant tilt and increased lordosis    Time  3    Period  Weeks    Status  New    Target Date  08/31/19      PT SHORT TERM GOAL #3   Title  Pt will be introduced to sleep hygiene and review positions/pain management for more comfortable sleep    Time  3    Period  Weeks    Status  New    Target Date  08/31/19        PT Long Term Goals - 08/10/19 1650      PT LONG TERM GOAL #1   Title  Demonstrate and verbalize techniques to  reduce the risk of injury including: lifting, posture, body mechanics and to maintain good posture/movement during inflammatory flares    Time  8    Period  Weeks    Status  New    Target Date  10/05/19      PT LONG TERM GOAL #2   Title  Decrease 5 x STS to 9 sec or less to show improvement with LE strength and decreased pain by 50%    Baseline  Eval 12.46 and painful rising from chair    Time  8    Period  Weeks    Status  New    Target Date  10/05/19      PT LONG TERM GOAL #3   Title  Pt will be able to lift at least 50 lb in order demonstrate proper technique and to return to strengthening in gym    Baseline  Pt not lifting due to pain from flare    Time  8    Period  Weeks    Status  New    Target Date  10/05/19      PT LONG TERM GOAL #4   Title  Pt will be educated on community wellness opportunities and organizations for knowledge of her condition    Baseline  no knowledge    Time  8    Period  Weeks    Status  New    Target Date  10/05/19      PT LONG TERM GOAL #5   Title  Pt will tolerate sitting for 1 hour in order to watch TV show or to ride in care with 75% decreased pain    Baseline  30 minutes tolerance to sitting    Time  8    Period  Weeks    Status  New    Target Date  10/05/19            Plan - 08/23/19 1458    Clinical Impression Statement  DN to left hip  decreased pain today. Began core engagement and stretching for her day. Reviewed seated posture and rationale. Asked her to consider other low-impact exercises rather than jogging all the time so she is still getting her exercise with less irritation to her joints. Pt reported pain in her feet upon mobilization of Lt SIJ.    PT Treatment/Interventions  Cryotherapy;Electrical Stimulation;Iontophoresis 24m/ml Dexamethasone;Traction;Moist Heat;Therapeutic exercise;Therapeutic activities;Functional mobility training;Neuromuscular re-education;Patient/family education;Passive range of motion;Manual  techniques;Dry needling;Energy conservation;Taping;Joint Manipulations;Spinal Manipulations    PT Next Visit Plan  Please check SI levels.  Pt with Ankylosing spondylitis , responded well to needling.  Has knee pain and needs Knee exericises, Treat like RA pt with knees. She has been unable to lie on stomach and needs to extend for back    PT Home Exercise Plan  handout for posture and body mechanincs/lifting, HSS, piriformis stretch, transv abdominis engagement    Consulted and Agree with Plan of Care  Patient       Patient will benefit from skilled therapeutic intervention in order to improve the following deficits and impairments:  Decreased mobility, Decreased range of motion, Decreased strength, Difficulty walking, Hypomobility, Impaired flexibility, Increased muscle spasms, Improper body mechanics, Postural dysfunction, Pain  Visit Diagnosis: Low back pain with sciatica, sciatica laterality unspecified, unspecified back pain laterality, unspecified chronicity  Chronic pain of left knee  Chronic pain of right knee  Muscle weakness (generalized)  Abnormal posture     Problem List Patient Active Problem List   Diagnosis Date Noted  . Wellness examination 09/11/2015  . Breast cancer screening 06/05/2014  . Anxiety state 06/05/2014  . Hyperlipidemia 06/05/2014  . Arthritis   . [place]ative [place] ([place]   . S[place]'s syndrome ([place]   . History of kidney disease as a child    Sueann Brownley C. Jaidy Cottam PT, DPT 08/23/19 3:08 PM   [facility] [address]Phone: [phone]  Fax:  [phone] Name: Laura HunsuckerMRN: [id number] of Birth: [birth year]

## 2019-09-06 ENCOUNTER — Other Ambulatory Visit: Payer: Self-pay

## 2019-09-06 ENCOUNTER — Ambulatory Visit: Payer: 59 | Admitting: Physical Therapy

## 2019-09-06 DIAGNOSIS — G8929 Other chronic pain: Secondary | ICD-10-CM

## 2019-09-06 DIAGNOSIS — M5440 Lumbago with sciatica, unspecified side: Secondary | ICD-10-CM | POA: Diagnosis not present

## 2019-09-06 NOTE — Therapy (Signed)
[place], [place], [address] Phone: [phone]   Fax:  [phone]  Physical Therapy Treatment  Patient Details  Name: Laura Finley MRN: [id number] Date of Birth: [birth year] Referring Provider (PT): Sherrie Mustache NP   Encounter Date: 09/06/2019   PT End of Session - 09/06/19 1020    Visit Number 3    Number of Visits 9    Date for PT Re-Evaluation 10/05/19    Authorization Type [employer] A[employer]    PT Start Time 1019    PT Stop Time 1105    PT Time Calculation (min) 46 min    Activity Tolerance Patient tolerated treatment well    Behavior During Therapy [facility] for tasks assessed/performed           Past Medical History:  Diagnosis Date  . ANA positive   . Arthritis 2012   reactive arthritis  . Foot pain   . Hand pain   . High risk medication use   . History of kidney disease as a child   . Osteoarthritis    c spine  . Raynaud's disease   . Sacroiliitis ([place])   . Sicca complex ([place])   . Sjogren's syndrome ([place])   . Spondyloarthritis   . Ulcerative colitis [facility])     Past Surgical History:  Procedure Laterality Date  . COLONOSCOPY    . TUBAL LIGATION      There were no vitals filed for this visit.   Subjective Assessment - 09/06/19 1020    Subjective Much better after the dry needling. [name] to pay attention to posture. 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                             [place] Adult PT Treatment/Exercise - 09/06/19 0001      Knee/Hip Exercises: Stretches   Hip Flexor Stretch Limitations supine perpendicular foam roller 3 min    Other Knee/Hip Stretches supine in 1/2 foam roll 2 min each    Other Knee/Hip Stretches SKTC with slight diagonal 2 min each      Knee/Hip Exercises: Seated   Other Seated Knee/Hip Exercises seated flexion hold weight in arms 3 min      Manual Therapy   Joint Mobilization supine FA IR spring Rt  30s, prone lower Rt quadrant sacral PA 2 min, supine Rt levator scap release, cervical traction                  PT Education - 09/06/19 1118    Education Details anatomy of condition showing on spine/pelvis model    Person(s) Educated Patient    Methods Explanation    Comprehension Verbalized understanding;Need further instruction            PT Short Term Goals - 08/10/19 1642      PT SHORT TERM GOAL #1   Title Independent with initial HEP for knees and back for stretching    Time 3    Period Weeks    Status New    Target Date 08/31/19      PT SHORT TERM GOAL #2   Title Demonstrate understanding of neutral posture and be more conscious of position and posture throughout the day.    Baseline Pt with ant tilt and increased lordosis    Time 3    Period Weeks    Status New    Target Date 08/31/19  PT SHORT TERM GOAL #3   Title Pt will be introduced to sleep hygiene and review positions/pain management for more comfortable sleep    Time 3    Period Weeks    Status New    Target Date 08/31/19             PT Long Term Goals - 08/10/19 1650      PT LONG TERM GOAL #1   Title Demonstrate and verbalize techniques to reduce the risk of injury including: lifting, posture, body mechanics and to maintain good posture/movement during inflammatory flares    Time 8    Period Weeks    Status New    Target Date 10/05/19      PT LONG TERM GOAL #2   Title Decrease 5 x STS to 9 sec or less to show improvement with LE strength and decreased pain by 50%    Baseline Eval 12.46 and painful rising from chair    Time 8    Period Weeks    Status New    Target Date 10/05/19      PT LONG TERM GOAL #3   Title Pt will be able to lift at least 50 lb in order demonstrate proper technique and to return to strengthening in gym    Baseline Pt not lifting due to pain from flare    Time 8    Period Weeks    Status New    Target Date 10/05/19      PT LONG TERM GOAL #4   Title  Pt will be educated on community wellness opportunities and organizations for knowledge of her condition    Baseline no knowledge    Time 8    Period Weeks    Status New    Target Date 10/05/19      PT LONG TERM GOAL #5   Title Pt will tolerate sitting for 1 hour in order to watch TV show or to ride in care with 75% decreased pain    Baseline 30 minutes tolerance to sitting    Time 8    Period Weeks    Status New    Target Date 10/05/19                 Plan - 09/06/19 1112    Clinical Impression Statement Signifcant decrease in muscular tension following DN last visit. Spring tests to Rt FA IR& lower Rt sacral quadrant improved mobility. Notable tightness with concordant pain upon palpation to Lt sacrotuberous ligament. Added LLLD stretches to HEP. Treatment reduced discomfort in lower back and decreased popping in cervical region. Followed by release of Rt levator scap and pt reported feeling really good.    PT Treatment/Interventions Cryotherapy;Electrical Stimulation;Iontophoresis 42m/ml Dexamethasone;Traction;Moist Heat;Therapeutic exercise;Therapeutic activities;Functional mobility training;Neuromuscular re-education;Patient/family education;Passive range of motion;Manual techniques;Dry needling;Energy conservation;Taping;Joint Manipulations;Spinal Manipulations    PT Next Visit Plan continue DN & SI eval PRN    PT Home Exercise Plan handout for posture and body mechanincs/lifting, HSS, piriformis stretch, transv abdominis engagement, supine SKTC/shoulder 2 min, supine piriformis release on foam roll, supine perpendicular foam roll under Lt gluts.    Consulted and Agree with Plan of Care Patient           Patient will benefit from skilled therapeutic intervention in order to improve the following deficits and impairments:  Decreased mobility, Decreased range of motion, Decreased strength, Difficulty walking, Hypomobility, Impaired flexibility, Increased muscle spasms, Improper  body mechanics, Postural dysfunction, Pain  Visit Diagnosis: Low  back pain with sciatica, sciatica laterality unspecified, unspecified back pain laterality, unspecified chronicity  Chronic pain of left knee     Problem List Patient Active Problem List   Diagnosis Date Noted  . Wellness examination 09/11/2015  . Breast cancer screening 06/05/2014  . Anxiety state 06/05/2014  . Hyperlipidemia 06/05/2014  . Arthritis   . Ulcerative colitis ([place])   . Sjogren's syndrome ([place])   . History of kidney disease as a child    Laura Finley C. Naomee Nowland PT, DPT 09/06/19 11:19 AM   [facility] [address] [place], [place], [address] Phone: [phone]   Fax:  [phone]  Name: Laura Finley MRN: [id number] Date of Birth: [birth year]

## 2019-09-16 ENCOUNTER — Ambulatory Visit: Payer: 59 | Attending: Nurse Practitioner | Admitting: Physical Therapy

## 2019-09-16 ENCOUNTER — Other Ambulatory Visit: Payer: Self-pay

## 2019-09-16 ENCOUNTER — Encounter: Payer: Self-pay | Admitting: Physical Therapy

## 2019-09-16 DIAGNOSIS — G8929 Other chronic pain: Secondary | ICD-10-CM | POA: Insufficient documentation

## 2019-09-16 DIAGNOSIS — M6281 Muscle weakness (generalized): Secondary | ICD-10-CM

## 2019-09-16 DIAGNOSIS — M5440 Lumbago with sciatica, unspecified side: Secondary | ICD-10-CM | POA: Insufficient documentation

## 2019-09-16 DIAGNOSIS — R293 Abnormal posture: Secondary | ICD-10-CM | POA: Diagnosis present

## 2019-09-16 DIAGNOSIS — M25561 Pain in right knee: Secondary | ICD-10-CM | POA: Diagnosis present

## 2019-09-16 DIAGNOSIS — M25562 Pain in left knee: Secondary | ICD-10-CM | POA: Diagnosis present

## 2019-09-16 NOTE — Therapy (Signed)
[place], [place], [address] Phone: [phone]   Fax:  [phone]  Physical Therapy Treatment  Patient Details  Name: Laura Finley MRN: [id number] Date of Birth: [birth year] Referring Provider (PT): Sherrie Mustache NP   Encounter Date: 09/16/2019   PT End of Session - 09/16/19 0930    Visit Number 4    Number of Visits 9    Date for PT Re-Evaluation 10/05/19    Authorization Type [employer] A[employer]    PT Start Time 0852    PT Stop Time 0946    PT Time Calculation (min) 54 min    Activity Tolerance Patient tolerated treatment well    Behavior During Therapy [facility] for tasks assessed/performed           Past Medical History:  Diagnosis Date   ANA positive    Arthritis 2012   reactive arthritis   Foot pain    Hand pain    High risk medication use    History of kidney disease as a child    Osteoarthritis    c spine   Raynaud's disease    Sacroiliitis ([place])    Sicca complex ([place])    Sjogren's syndrome ([place])    Spondyloarthritis    Ulcerative colitis ([place])     Past Surgical History:  Procedure Laterality Date   COLONOSCOPY     TUBAL LIGATION      There were no vitals filed for this visit.   Subjective Assessment - 09/16/19 0857    Subjective I feel much better after the needling.  I read all the information given to me from the [employer]    Pertinent History Sjogrens syndrom,  Sacroillitis, OA, spondyloarthritis,Ulcerative colitis    How long can you sit comfortably? 30 minutes    Diagnostic tests xray    Patient Stated Goals My goal when I have a flare,I need a HEP  What can I do to help me instead of taking motrin/aleve I already use ice and heat    Currently in Pain? Yes    Pain Score 2     Pain Location Back    Pain Orientation Left;Lower    Pain Onset More than a month ago                             [facility] Adult PT  Treatment/Exercise - 09/16/19 0001      Self-Care   Posture how to measure at home for AROM of back for AS      Knee/Hip Exercises: Standing   Other Standing Knee Exercises hip hinge with dowel 30 # and then 20 x with external cue of hips on wall. Deadlift with 25# KB ( education on technique and proper execution,  then 3 rounds of 10 goblet squats with 25#, deadlift 8 x with 25# KB      Knee/Hip Exercises: Supine   Other Supine Knee/Hip Exercises transv abdominis engagment- supine & seated      Manual Therapy   Manual Therapy Joint mobilization;Soft tissue mobilization    Manual therapy comments skilled palpation and monitoring during TPDN    Joint Mobilization Lt SIJ bil lumbar    Soft tissue mobilization LT gluts, piriformis            Trigger Point Dry Needling - 09/16/19 0001    Consent Given? Yes    Education Handout Provided Previously provided  Muscles Treated Back/Hip Piriformis;Lumbar multifidi    Gluteus Maximus Response Twitch response elicited   LT   Piriformis Response Twitch response elicited;Palpable increased muscle length   LEft   Erector spinae Response Twitch response elicited;Palpable increased muscle length   L5 S1 bil   Lumbar multifidi Response Twitch response elicited   L5 S1               PT Education - 09/16/19 0910    Education Details added basic strength for the gym,  education for how to measure self at home for AS    Person(s) Educated Patient    Methods Explanation;Demonstration;Tactile cues;Verbal cues;Handout    Comprehension Verbalized understanding;Returned demonstration            PT Short Term Goals - 09/16/19 1327      PT SHORT TERM GOAL #1   Title Independent with initial HEP for knees and back for stretching    Time 3    Period Weeks    Status Achieved    Target Date 08/31/19      PT SHORT TERM GOAL #2   Title Demonstrate understanding of neutral posture and be more conscious of position and posture throughout the  day.    Baseline Pt more aware of posture throughout day    Time 3    Period Weeks    Status Achieved    Target Date 08/31/19      PT SHORT TERM GOAL #3   Title Pt will be introduced to sleep hygiene and review positions/pain management for more comfortable sleep    Baseline Pt introduece to sleep hygiene    Time 3    Period Weeks    Status On-going    Target Date 08/31/19             PT Long Term Goals - 08/10/19 1650      PT LONG TERM GOAL #1   Title Demonstrate and verbalize techniques to reduce the risk of injury including: lifting, posture, body mechanics and to maintain good posture/movement during inflammatory flares    Time 8    Period Weeks    Status New    Target Date 10/05/19      PT LONG TERM GOAL #2   Title Decrease 5 x STS to 9 sec or less to show improvement with LE strength and decreased pain by 50%    Baseline Eval 12.46 and painful rising from chair    Time 8    Period Weeks    Status New    Target Date 10/05/19      PT LONG TERM GOAL #3   Title Pt will be able to lift at least 50 lb in order demonstrate proper technique and to return to strengthening in gym    Baseline Pt not lifting due to pain from flare    Time 8    Period Weeks    Status New    Target Date 10/05/19      PT LONG TERM GOAL #4   Title Pt will be educated on community wellness opportunities and organizations for knowledge of her condition    Baseline no knowledge    Time 8    Period Weeks    Status New    Target Date 10/05/19      PT LONG TERM GOAL #5   Title Pt will tolerate sitting for 1 hour in order to watch TV show or to ride in care with 75% decreased pain  Baseline 30 minutes tolerance to sitting    Time 8    Period Weeks    Status New    Target Date 10/05/19               Access Code: [email or web address]: [email or web address]: 07/01/2021Prepared by: Donnetta Simpers BeardsleyExercises  Standing Hip Hinge with Dowel - 1 x daily - 7 x weekly - 10 reps -  3 sets  Goblet Squat with Kettlebell - 1 x daily - 7 x weekly - 3 sets - 10 reps  Kettlebell Deadlift - 1 x daily - 7 x weekly - 3 sets - 8 reps   Plan - 09/16/19 1331    Clinical Impression Statement Pt returns to clinic with 2/10 low back pain and feeling like exercises/stretcing really making a difference.  Pt consents to TPDN and is closely monitored throughout session.  Achieved STG # 1 and # 2 Pt then educated on proper lifting of deadlift and hip hinge and box squat  for return to gym activities.  will continue POC For completion of goals    Personal Factors and Comorbidities Comorbidity 1;Comorbidity 2    Comorbidities Sjogrens syndrom,  Sacroillitis, OA, spondyloarthritis,Ulcerative colitis    PT Treatment/Interventions Cryotherapy;Electrical Stimulation;Iontophoresis 81m/ml Dexamethasone;Traction;Moist Heat;Therapeutic exercise;Therapeutic activities;Functional mobility training;Neuromuscular re-education;Patient/family education;Passive range of motion;Manual techniques;Dry needling;Energy conservation;Taping;Joint Manipulations;Spinal Manipulations    PT Next Visit Plan continue DN & SI/low back  PRN for ankylosing Spondylitie    PT Home Exercise Plan handout for posture and body mechanincs/lifting, HSS, piriformis stretch, transv abdominis engagement, supine SKTC/shoulder 2 min, supine piriformis release on foam roll, supine perpendicular foam roll under Lt gluts.deadlift , squat and hip hinge    Consulted and Agree with Plan of Care Patient           Patient will benefit from skilled therapeutic intervention in order to improve the following deficits and impairments:  Decreased mobility, Decreased range of motion, Decreased strength, Difficulty walking, Hypomobility, Impaired flexibility, Increased muscle spasms, Improper body mechanics, Postural dysfunction, Pain  Visit Diagnosis: Low back pain with sciatica, sciatica laterality unspecified, unspecified back pain laterality,  unspecified chronicity  Chronic pain of left knee  Chronic pain of right knee  Muscle weakness (generalized)  Abnormal posture     Problem List Patient Active Problem List   Diagnosis Date Noted   Wellness examination 09/11/2015   Breast cancer screening 06/05/2014   Anxiety state 06/05/2014   Hyperlipidemia 06/05/2014   Arthritis    [place]ative colitis [place]    [place]'s syndrome [place]    History of kidney disease as a child     LVoncille Lo PT Certified Exercise Expert for the Aging Adult  09/16/19 1:40 PM Phone: [phone]: 3[facility] [place] [address]Phone: [phone]  Fax:  [phone] Name: FAttallah OntkoMRN: [id number] of Birth: [birth year]

## 2019-09-16 NOTE — Patient Instructions (Addendum)
Access Code: [email or web address]: [email or web address]: 07/01/2021Prepared by: Donnetta Simpers BeardsleyExercises  Standing Hip Hinge with Dowel - 1 x daily - 7 x weekly - 10 reps - 3 sets  Goblet Squat with Kettlebell - 1 x daily - 7 x weekly - 3 sets - 10 reps  Kettlebell Deadlift - 1 x daily - 7 x weekly - 3 sets - 8 reps   Voncille Lo, PT Certified Exercise Expert for the Aging Adult  09/16/19 9:10 AM Phone: [phone] Fax: [phone]

## 2019-09-21 ENCOUNTER — Other Ambulatory Visit: Payer: Self-pay

## 2019-09-21 ENCOUNTER — Encounter: Payer: Self-pay | Admitting: Physical Therapy

## 2019-09-21 ENCOUNTER — Ambulatory Visit: Payer: 59 | Admitting: Physical Therapy

## 2019-09-21 DIAGNOSIS — M5440 Lumbago with sciatica, unspecified side: Secondary | ICD-10-CM

## 2019-09-21 NOTE — Therapy (Signed)
[place], [place], [address] Phone: [phone]   Fax:  [phone]  Physical Therapy Treatment  Patient Details  Name: Laura Finley MRN: [id number] Date of Birth: 16-Aug-1975 Referring Provider (PT): Sherrie Mustache NP   Encounter Date: 09/21/2019   PT End of Session - 09/21/19 1111    Visit Number 5    Number of Visits 9    Date for PT Re-Evaluation 10/05/19    Authorization Type [employer] A[employer]    PT Start Time 1104    PT Stop Time 1142    PT Time Calculation (min) 38 min    Activity Tolerance Patient tolerated treatment well    Behavior During Therapy [facility] for tasks assessed/performed           Past Medical History:  Diagnosis Date  . ANA positive   . Arthritis 2012   reactive arthritis  . Foot pain   . Hand pain   . High risk medication use   . History of kidney disease as a child   . Osteoarthritis    c spine  . Raynaud's disease   . Sacroiliitis ([place])   . Sicca complex ([place])   . Sjogren's syndrome ([place])   . Spondyloarthritis   . Ulcerative colitis [facility])     Past Surgical History:  Procedure Laterality Date  . COLONOSCOPY    . TUBAL LIGATION      There were no vitals filed for this visit.   Subjective Assessment - 09/21/19 1108    Subjective I had some inflammation in my Rt knee and wrist that are still sore. Lower back just feels pressure. Able to lift 25lb weights.    Patient Stated Goals My goal when I have a flare,I need a HEP  What can I do to help me instead of taking motrin/aleve I already use ice and heat                             [facility] Adult PT Treatment/Exercise - 09/21/19 0001      Knee/Hip Exercises: Stretches   Piriformis Stretch Limitations supine figure 4      Knee/Hip Exercises: Standing   SLS golfer hinge    Other Standing Knee Exercises reviewed hip hinge/squat    Other Standing Knee Exercises sumo squat 25lb      Knee/Hip  Exercises: Sidelying   Other Sidelying Knee/Hip Exercises side planks with clam    Other Sidelying Knee/Hip Exercises hip burner: small circles, large circles, arcs with table taps      Knee/Hip Exercises: Prone   Other Prone Exercises planks: knee/toe, knee/elbow                    PT Short Term Goals - 09/16/19 1327      PT SHORT TERM GOAL #1   Title Independent with initial HEP for knees and back for stretching    Time 3    Period Weeks    Status Achieved    Target Date 08/31/19      PT SHORT TERM GOAL #2   Title Demonstrate understanding of neutral posture and be more conscious of position and posture throughout the day.    Baseline Pt more aware of posture throughout day    Time 3    Period Weeks    Status Achieved    Target Date 08/31/19      PT SHORT TERM GOAL #  3   Title Pt will be introduced to sleep hygiene and review positions/pain management for more comfortable sleep    Baseline Pt introduece to sleep hygiene    Time 3    Period Weeks    Status On-going    Target Date 08/31/19             PT Long Term Goals - 08/10/19 1650      PT LONG TERM GOAL #1   Title Demonstrate and verbalize techniques to reduce the risk of injury including: lifting, posture, body mechanics and to maintain good posture/movement during inflammatory flares    Time 8    Period Weeks    Status New    Target Date 10/05/19      PT LONG TERM GOAL #2   Title Decrease 5 x STS to 9 sec or less to show improvement with LE strength and decreased pain by 50%    Baseline Eval 12.46 and painful rising from chair    Time 8    Period Weeks    Status New    Target Date 10/05/19      PT LONG TERM GOAL #3   Title Pt will be able to lift at least 50 lb in order demonstrate proper technique and to return to strengthening in gym    Baseline Pt not lifting due to pain from flare    Time 8    Period Weeks    Status New    Target Date 10/05/19      PT LONG TERM GOAL #4   Title Pt  will be educated on [employer] opportunities and organizations for knowledge of her condition    Baseline no knowledge    Time 8    Period Weeks    Status New    Target Date 10/05/19      PT LONG TERM GOAL #5   Title Pt will tolerate sitting for 1 hour in order to watch TV show or to ride in care with 75% decreased pain    Baseline 30 minutes tolerance to sitting    Time 8    Period Weeks    Status New    Target Date 10/05/19                 Plan - 09/21/19 1142    Clinical Impression Statement Minimal pain so treatment today focused on progression of strength training for HEP- both [facility] and [facility]. Noted difficulty controlling balance so single leg hinge was added for functional lifting.    PT Treatment/Interventions Cryotherapy;Electrical Stimulation;Iontophoresis 70m/ml Dexamethasone;Traction;Moist Heat;Therapeutic exercise;Therapeutic activities;Functional mobility training;Neuromuscular re-education;Patient/family education;Passive range of motion;Manual techniques;Dry needling;Energy conservation;Taping;Joint Manipulations;Spinal Manipulations    PT Next Visit Plan review planks, continue manual PRN, progress single leg balance    PT Home Exercise Plan handout for posture and body mechanincs/lifting, HSS, piriformis stretch, transv abdominis engagement, supine SKTC/shoulder 2 min, supine piriformis release on foam roll, supine perpendicular foam roll under Lt gluts.deadlift , squat and hip hinge, golver hinge, hip burner, plank, side plank with clam    Consulted and Agree with Plan of Care Patient           Patient will benefit from skilled therapeutic intervention in order to improve the following deficits and impairments:  Decreased mobility, Decreased range of motion, Decreased strength, Difficulty walking, Hypomobility, Impaired flexibility, Increased muscle spasms, Improper body mechanics, Postural dysfunction, Pain  Visit Diagnosis: Low back pain with sciatica,  sciatica laterality unspecified, unspecified back pain  laterality, unspecified chronicity     Problem List Patient Active Problem List   Diagnosis Date Noted  . Wellness examination 09/11/2015  . Breast cancer screening 06/05/2014  . Anxiety state 06/05/2014  . Hyperlipidemia 06/05/2014  . Arthritis   . Ulcerative colitis ([place])   . Sjogren's syndrome ([place])   . History of kidney disease as a child    Hanford Lust C. Pelham Hennick PT, DPT 09/21/19 11:47 AM   [facility] [address] [place], [place], [address] Phone: [phone]   Fax:  [phone]  Name: Malijah Lietz MRN: [id number] Date of Birth: [birth year]

## 2019-09-30 ENCOUNTER — Ambulatory Visit: Payer: 59 | Admitting: Physical Therapy

## 2019-09-30 ENCOUNTER — Encounter: Payer: Self-pay | Admitting: Physical Therapy

## 2019-09-30 ENCOUNTER — Other Ambulatory Visit: Payer: Self-pay

## 2019-09-30 DIAGNOSIS — M5440 Lumbago with sciatica, unspecified side: Secondary | ICD-10-CM | POA: Diagnosis not present

## 2019-09-30 DIAGNOSIS — R293 Abnormal posture: Secondary | ICD-10-CM

## 2019-09-30 DIAGNOSIS — M6281 Muscle weakness (generalized): Secondary | ICD-10-CM

## 2019-09-30 DIAGNOSIS — G8929 Other chronic pain: Secondary | ICD-10-CM

## 2019-09-30 DIAGNOSIS — M25562 Pain in left knee: Secondary | ICD-10-CM

## 2019-09-30 NOTE — Therapy (Signed)
[place], [place], [address] Phone: [phone]   Fax:  [phone]  Physical Therapy Treatment  Patient Details  Name: Laura Finley MRN: [id number] Date of Birth: [birth year] Referring Provider (PT): Sherrie Mustache NP   Encounter Date: 09/30/2019   PT End of Session - 09/30/19 0802    Visit Number 6    Number of Visits 9    Date for PT Re-Evaluation 10/05/19    Authorization Type [employer] A[employer]    PT Start Time 0801    PT Stop Time 0843    PT Time Calculation (min) 42 min    Activity Tolerance Patient tolerated treatment well    Behavior During Therapy [facility] for tasks assessed/performed           Past Medical History:  Diagnosis Date  . ANA positive   . Arthritis 2012   reactive arthritis  . Foot pain   . Hand pain   . High risk medication use   . History of kidney disease as a child   . Osteoarthritis    c spine  . Raynaud's disease   . Sacroiliitis ([place])   . Sicca complex ([place])   . Sjogren's syndrome ([place])   . Spondyloarthritis   . Ulcerative colitis [facility])     Past Surgical History:  Procedure Laterality Date  . COLONOSCOPY    . TUBAL LIGATION      There were no vitals filed for this visit.   Subjective Assessment - 09/30/19 0806    Subjective Yesterday was rouch because of my RT knee was inflamed and LT foot.  I took tylenol and advil and I was able to sleep.    Pertinent History Sjogrens syndrom,  Sacroillitis, OA, spondyloarthritis,Ulcerative colitis    How long can you sit comfortably? Icould sit through a movie    How long can you stand comfortably? an hour or more to do chores    How long can you walk comfortably? I can walk 3 miles now that I have medicine and my exercise helps    Diagnostic tests xray    Patient Stated Goals My goal when I have a flare,I need a HEP  What can I do to help me instead of taking motrin/aleve I already use ice and heat    Currently  in Pain? Yes    Pain Score 0-No pain    Pain Location Back    Pain Score 3    Pain Location Knee   LT medial foot   Pain Orientation Right    Pain Descriptors / Indicators Aching    Pain Onset More than a month ago    Pain Frequency Intermittent                       Taught and performed in RX session   LOWER BODY  EMOM every minute on the minute  Do 2 to 3 rounds 1a)  Deadlift 2 x 8 1b) Glute Bridge 3 x 10 1c) Goblet squat  15 x   Do 2 -3 rounds 2a) superman 12 x 2b) elbow  plank for 45sec 2c)  Wall sit for 10 sec x 6   work on this at home eccentric lowering of Single leg squat  (on one leg lower yourself with good control 8x Rt and 8x LT    [facility] Adult PT Treatment/Exercise - 09/30/19 0001      Self-Care   Posture educated on gym  equipment use and EMOM rounds for building endurance and strength      Knee/Hip Exercises: Standing   Forward Step Up Hand Hold: 1;Right;Left;10 reps;2 sets    Other Standing Knee Exercises deadlift with 30#KB 2x 8, 3 x 10 bridge, 15 x goblet squat  2 rounds of this  Able to lift 60# wt bar x 5 at end of session     Knee/Hip Exercises: Supine   Bridges 3 sets;10 reps    Other Supine Knee/Hip Exercises 2 rounds of superman 15 x, elbow plank for 45 sec, wall sit for 10 sec x 6                   PT Education - 09/30/19 0841    Education Details educated on EMOM's and gym equipment    Person(s) Educated Patient    Methods Explanation;Demonstration;Tactile cues;Verbal cues;Handout    Comprehension Verbalized understanding;Returned demonstration            PT Short Term Goals - 09/16/19 1327      PT SHORT TERM GOAL #1   Title Independent with initial HEP for knees and back for stretching    Time 3    Period Weeks    Status Achieved    Target Date 08/31/19      PT SHORT TERM GOAL #2   Title Demonstrate understanding of neutral posture and be more conscious of position and posture throughout the day.    Baseline  Pt more aware of posture throughout day    Time 3    Period Weeks    Status Achieved    Target Date 08/31/19      PT SHORT TERM GOAL #3   Title Pt will be introduced to sleep hygiene and review positions/pain management for more comfortable sleep    Baseline Pt introduece to sleep hygiene    Time 3    Period Weeks    Status On-going    Target Date 08/31/19             PT Long Term Goals - 09/30/19 0842      PT LONG TERM GOAL #1   Title Demonstrate and verbalize techniques to reduce the risk of injury including: lifting, posture, body mechanics and to maintain good posture/movement during inflammatory flares    Baseline Pt able to state strategies    Time 8    Period Weeks    Status Achieved      PT LONG TERM GOAL #2   Title Decrease 5 x STS to 9 sec or less to show improvement with LE strength and decreased pain by 50%    Baseline 5 x sts 8.36    Time 8    Period Weeks    Status Achieved      PT LONG TERM GOAL #3   Title Pt will be able to lift at least 50 lb in order demonstrate proper technique and to return to strengthening in gym    Baseline Able to lift 60# on lift bar x 5    Time 8    Period Weeks    Status Achieved      PT LONG TERM GOAL #4   Title Pt will be educated on community wellness opportunities and organizations for knowledge of her condition    Baseline no knowledge    Time 8    Period Weeks    Status On-going      PT LONG TERM GOAL #5   Title  Pt will tolerate sitting for 1 hour in order to watch TV show or to ride in care with 75% decreased pain    Baseline able to watch movie now with pain management strategies    Time 8    Period Weeks    Status On-going                 Plan - 09/30/19 1003    Clinical Impression Statement Pt enters clinic with 0/10 pain in back but irriation in RT knee3/10 and LT medial foot.  Pt probably irriation from using muscles and DOMs with new exericises and lifting as well on gym equipment.  Pt was  educated on DOMS and EMOMS for HEP to increase strength and endurance.  Pt acheived LTG #1 , 2 and 3 and is making good progress toward DC at next visit Will continue POC    Personal Factors and Comorbidities Comorbidity 1;Comorbidity 2    Comorbidities Sjogrens syndrom,  Sacroillitis, OA, spondyloarthritis,Ulcerative colitis    PT Treatment/Interventions Cryotherapy;Electrical Stimulation;Iontophoresis 49m/ml Dexamethasone;Traction;Moist Heat;Therapeutic exercise;Therapeutic activities;Functional mobility training;Neuromuscular re-education;Patient/family education;Passive range of motion;Manual techniques;Dry needling;Energy conservation;Taping;Joint Manipulations;Spinal Manipulations    PT Next Visit Plan review planks, continue manual PRN, progress single leg balance DC next visit    PT Home Exercise Plan handout for posture and body mechanincs/lifting, HSS, piriformis stretch, transv abdominis engagement, supine SKTC/shoulder 2 min, supine piriformis release on foam roll, supine perpendicular foam roll under Lt gluts.deadlift , squat and hip hinge, golver hinge, hip burner, plank, side plank with clam EMOMS    Consulted and Agree with Plan of Care Patient           Patient will benefit from skilled therapeutic intervention in order to improve the following deficits and impairments:  Decreased mobility, Decreased range of motion, Decreased strength, Difficulty walking, Hypomobility, Impaired flexibility, Increased muscle spasms, Improper body mechanics, Postural dysfunction, Pain  Visit Diagnosis: Low back pain with sciatica, sciatica laterality unspecified, unspecified back pain laterality, unspecified chronicity  Chronic pain of left knee  Chronic pain of right knee  Muscle weakness (generalized)  Abnormal posture     Problem List Patient Active Problem List   Diagnosis Date Noted  . Wellness examination 09/11/2015  . Breast cancer screening 06/05/2014  . Anxiety state  06/05/2014  . Hyperlipidemia 06/05/2014  . Arthritis   . Ulcerative colitis ([place]   . Sjogren's syndrome ([place]   . History of kidney disease as a child     LVoncille Lo PT Certified Exercise Expert for the Aging Adult  09/30/19 10:07 AM Phone: [phone]: 3[facility] [address]Phone: [phone]  Fax:  [phone] Name: Laura RunyanMRN: [id number] of Birth: [birth year]

## 2019-09-30 NOTE — Patient Instructions (Addendum)
LOWER BODY  EMOM every minute on the minute  Do 2 to 3 rounds 1a)  Deadlift 2 x 8 1b) Glute Bridge 3 x 10 1c) Goblet squat  15 x   Do 2 -3 rounds 2a) superman 12 x 2b) elbow  plank for 45sec 2c)  Wall sit for 10 sec x 6   work on this at home eccentric lowering of Single leg squat  (on one leg lower yourself with good control 8x Rt and 8x LT  Voncille Lo, PT Certified Exercise Expert for the Aging Adult  09/30/19 8:43 AM Phone: [phone] Fax: [phone]

## 2019-10-05 ENCOUNTER — Ambulatory Visit: Payer: 59 | Admitting: Physical Therapy

## 2019-10-05 ENCOUNTER — Encounter: Payer: Self-pay | Admitting: Physical Therapy

## 2019-10-05 ENCOUNTER — Other Ambulatory Visit: Payer: Self-pay

## 2019-10-05 DIAGNOSIS — R293 Abnormal posture: Secondary | ICD-10-CM

## 2019-10-05 DIAGNOSIS — G8929 Other chronic pain: Secondary | ICD-10-CM

## 2019-10-05 DIAGNOSIS — M25561 Pain in right knee: Secondary | ICD-10-CM

## 2019-10-05 DIAGNOSIS — M6281 Muscle weakness (generalized): Secondary | ICD-10-CM

## 2019-10-05 DIAGNOSIS — M5440 Lumbago with sciatica, unspecified side: Secondary | ICD-10-CM | POA: Diagnosis not present

## 2019-10-05 NOTE — Therapy (Signed)
[place], [place], [address] Phone: [phone]   Fax:  [phone]  Physical Therapy Treatment/Discharge Note  Patient Details  Name: Laura Finley MRN: [id number] Date of Birth: Jul 23, 1975 Referring Provider (PT): Sherrie Mustache NP   Encounter Date: 10/05/2019   PT End of Session - 10/05/19 0814    Visit Number 7    Number of Visits 9    Date for PT Re-Evaluation 10/05/19    Authorization Type [employer] A[employer]    PT Start Time 0814    PT Stop Time 0853    PT Time Calculation (min) 39 min    Activity Tolerance Patient tolerated treatment well    Behavior During Therapy [facility] for tasks assessed/performed           Past Medical History:  Diagnosis Date  . ANA positive   . Arthritis 2012   reactive arthritis  . Foot pain   . Hand pain   . High risk medication use   . History of kidney disease as a child   . Osteoarthritis    c spine  . Raynaud's disease   . Sacroiliitis ([place])   . Sicca complex ([place])   . Sjogren's syndrome ([place])   . Spondyloarthritis   . Ulcerative colitis [facility])     Past Surgical History:  Procedure Laterality Date  . COLONOSCOPY    . TUBAL LIGATION      There were no vitals filed for this visit.   Subjective Assessment - 10/05/19 0815    Subjective I am not feeling well, I feel like I am in a flare with my ulcerative colitis    Pertinent History Sjogrens syndrom,  Sacroillitis, OA, spondyloarthritis,Ulcerative colitis    How long can you sit comfortably? Icould sit through a movie    How long can you stand comfortably? an hour or more to do chores    How long can you walk comfortably? I can walk 3 miles now that I have medicine and my exercise helps    Diagnostic tests xray    Patient Stated Goals My goal when I have a flare,I need a HEP  What can I do to help me instead of taking motrin/aleve I already use ice and heat    Currently in Pain? No/denies     Pain Score 0-No pain    Pain Location Back    Pain Score 0    Pain Location Knee    Pain Orientation Right    Pain Descriptors / Indicators Aching    Pain Onset More than a month ago              [facility] PT Assessment - 10/05/19 0001      Assessment   Medical Diagnosis RA(ankylosing spondylitis?) bil knee pain and LBP    Referring Provider (PT) Sherrie Mustache NP    Onset Date/Surgical Date 08/09/16   DX with reactive before this time but Now AS     Observation/Other Assessments   Focus on Therapeutic Outcomes (FOTO)  FOTO - no specific body part        Functional Tests   Functional tests Squat;Lunges;Sit to Stand      Squat   Comments Pt now able to squat , uses  a weight under heel in order to squat below parallel      Lunges   Comments able to lunge walk x 10       Sit to Stand   Comments 5  x STS 8.69 sec      ROM / Strength   AROM / PROM / Strength AROM;Strength      AROM   Overall AROM  Deficits    Right Knee Flexion 134    Left Knee Extension 0    Left Knee Flexion 139    Lumbar Flexion 90%    Lumbar Extension 80%    Lumbar - Right Side Bend 90%    Lumbar - Left Side Bend 90%    Lumbar - Right Rotation 90%    Lumbar - Left Rotation 90%      Strength   Overall Strength Deficits    Right Hip Flexion 5/5    Right Hip Extension 4+/5    Right Hip ABduction 4+/5    Left Hip Flexion 5/5    Left Hip ABduction 4+/5    Right Knee Flexion 5/5    Right Knee Extension 5/5    Left Knee Flexion 5/5    Left Knee Extension 5/5      Palpation   Palpation comment Pt still with tenderness but able to work out soreness with physical activity                         [facility] Adult PT Treatment/Exercise - 10/05/19 0001      Self-Care   Self-Care Other Self-Care Comments    Posture --    Other Self-Care Comments  educated on sleep hygiene , hydration,  and reviewed communty wellness opportuniites      Knee/Hip Exercises: Stretches   Piriformis  Stretch Limitations supine figure 4      Knee/Hip Exercises: Standing   SLS golfer hinge    Other Standing Knee Exercises reviewed HEP with hip hinge, deadlift and goblet sqaut and taught dynamic stretch with lunge and thoracic rotation wit demo      Knee/Hip Exercises: Supine   Bridges 3 sets;10 reps    Other Supine Knee/Hip Exercises decompression exericses of LE's leg lengthener and Leg press      Knee/Hip Exercises: Prone   Other Prone Exercises planks: knee/toe, knee/elbow    Other Prone Exercises prone superman                  PT Education - 10/05/19 0824    Education Details added to HEP for decompression exericises, sleep hygiene, hydration and basic hand AROM/Reviewed HEP    Person(s) Educated Patient    Methods Explanation;Demonstration;Tactile cues;Verbal cues;Handout    Comprehension Verbalized understanding;Returned demonstration            PT Short Term Goals - 10/05/19 1347      PT SHORT TERM GOAL #1   Title Independent with initial HEP for knees and back for stretching    Time 3    Period Weeks    Status Achieved      PT SHORT TERM GOAL #2   Title Demonstrate understanding of neutral posture and be more conscious of position and posture throughout the day.    Baseline Pt more aware of posture throughout day    Time 3    Period Weeks    Status Achieved      PT SHORT TERM GOAL #3   Title Pt will be introduced to sleep hygiene and review positions/pain management for more comfortable sleep    Baseline utiliziing sleep hygiene principles    Time 3    Period Weeks    Status Achieved  PT Long Term Goals - 10/05/19 0842      PT LONG TERM GOAL #1   Title Demonstrate and verbalize techniques to reduce the risk of injury including: lifting, posture, body mechanics and to maintain good posture/movement during inflammatory flares    Baseline Pt able to state strategies    Time 8    Period Weeks    Status Achieved      PT LONG TERM  GOAL #2   Title Decrease 5 x STS to 9 sec or less to show improvement with LE strength and decreased pain by 50%    Baseline 5 x sts 8.69 sec    Time 8    Period Weeks    Status Achieved      PT LONG TERM GOAL #3   Title Pt will be able to lift at least 50 lb in order demonstrate proper technique and to return to strengthening in gym    Baseline Able to lift 60# on lift bar x 5    Time 8    Period Weeks    Status Achieved      PT LONG TERM GOAL #4   Title Pt will be educated on community wellness opportunities and organizations for knowledge of her condition    Baseline educated on community wellness /fitness opportunities    Time 8    Period Weeks    Status Achieved      PT LONG TERM GOAL #5   Title Pt will tolerate sitting for 1 hour in order to watch TV show or to ride in care with 75% decreased pain    Baseline able to watch movie now with pain management strategies    Time 8    Period Weeks    Status Achieved                 Plan - 10/05/19 1350    Clinical Impression Statement Pt enters clinic with no pain and ready to begin exercise in community wellness/fitness gyms now that she understands principles of her  autoimmune dz. Pt able to deadlift 60# with good form.  Pt is independent with all HEP given and educated on communty wellness opportuniites after DC. Pt given decompression ex to morning routine and sleep hygiene written. Pt has achieved all LTG's and is ready for DC.    Personal Factors and Comorbidities Comorbidity 1;Comorbidity 2    Comorbidities Sjogrens syndrom,  Sacroillitis, OA, spondyloarthritis,Ulcerative colitis    PT Frequency 1x / week    PT Duration 8 weeks    PT Treatment/Interventions Cryotherapy;Electrical Stimulation;Iontophoresis 29m/ml Dexamethasone;Traction;Moist Heat;Therapeutic exercise;Therapeutic activities;Functional mobility training;Neuromuscular re-education;Patient/family education;Passive range of motion;Manual techniques;Dry  needling;Energy conservation;Taping;Joint Manipulations;Spinal Manipulations    PT Next Visit Plan DC    PT Home Exercise Plan handout for posture and body mechanincs/lifting, HSS, piriformis stretch, transv abdominis engagement, supine SKTC/shoulder 2 min, supine piriformis release on foam roll, supine perpendicular foam roll under Lt gluts.deadlift , squat and hip hinge, golver hinge, hip burner, plank, side plank with clam EMOMS    Consulted and Agree with Plan of Care Patient           Patient will benefit from skilled therapeutic intervention in order to improve the following deficits and impairments:  Decreased mobility, Decreased range of motion, Decreased strength, Difficulty walking, Hypomobility, Impaired flexibility, Increased muscle spasms, Improper body mechanics, Postural dysfunction, Pain  Visit Diagnosis: Low back pain with sciatica, sciatica laterality unspecified, unspecified back pain laterality, unspecified chronicity  Chronic pain  of left knee  Chronic pain of right knee  Muscle weakness (generalized)  Abnormal posture     Problem List Patient Active Problem List   Diagnosis Date Noted  . Wellness examination 09/11/2015  . Breast cancer screening 06/05/2014  . Anxiety state 06/05/2014  . Hyperlipidemia 06/05/2014  . Arthritis   . Ulcerative colitis ([place])   . Sjogren's syndrome ([place])   . History of kidney disease as a child     Voncille Lo, PT Certified Exercise Expert for the Aging Adult  10/05/19 2:02 PM Phone: [phone] Fax: [place] [facility] [address] [place], [place], [address] Phone: [phone]   Fax:  [phone]  Name: Laura Finley MRN: [id number] Date of Birth: 11/10/1975  PHYSICAL THERAPY DISCHARGE SUMMARY  Visits from Start of Care: 7  Current functional level related to goals / functional outcomes: As above   Remaining deficits: None       Education / Equipment: HEP Plan: Patient agrees to discharge.  Patient goals were met. Patient is being discharged due to meeting the stated rehab goals.  ?????    And being pleased with current level of function  Voncille Lo, PT Certified Exercise Expert for the Aging Adult  10/05/19 2:02 PM Phone: [phone] Fax: [phone]

## 2019-10-05 NOTE — Patient Instructions (Signed)
Sleep Tips    .Keep a consistent sleep schedule. Get up at the same time every day, even on weekends or during vacations. .Set a bedtime that is early enough for you to get at least 7 hours of sleep. .Don't go to bed unless you are sleepy.  .If you don't fall asleep after 20 minutes, get out of bed.  .Establish a relaxing bedtime routine.  .Use your bed only for sleep and sex.  .Make your bedroom quiet and relaxing. Keep the room at a comfortable, cool temperature.  .Limit exposure to bright light in the evenings. .Turn off electronic devices at least 30 minutes before bedtime. .Don't eat a large meal before bedtime. If you are hungry at night, eat a light, healthy snack.  .Exercise regularly and maintain a healthy diet.  Marland KitchenAvoid consuming caffeine in the late afternoon or evening.  .Avoid consuming alcohol before bedtime.  .Reduce your fluid intake before bedtime.  Pt given handout on Decompression Exercises

## 2020-01-13 ENCOUNTER — Other Ambulatory Visit: Payer: Self-pay

## 2020-01-13 ENCOUNTER — Ambulatory Visit (INDEPENDENT_AMBULATORY_CARE_PROVIDER_SITE_OTHER): Payer: 59 | Admitting: Podiatry

## 2020-01-13 DIAGNOSIS — B351 Tinea unguium: Secondary | ICD-10-CM | POA: Diagnosis not present

## 2020-01-13 MED ORDER — NONFORMULARY OR COMPOUNDED ITEM
3 refills | Status: DC
Start: 2020-01-13 — End: 2020-12-29

## 2020-01-13 NOTE — Progress Notes (Signed)
Subjective:   Patient ID: Laura Finley, female   DOB: 43 y.o.   MRN: [id number]   HPI 44 year old female presents the office with concerns of thick, discolored toenails with yellow, white discoloration of the nails.  Mostly on the big toenails and over the summer she was wearing gel nail polish and she removed it and she noticed that other nails becoming discolored.  She has no pain of the nails no redness or drainage.  She did treat this with over-the-counter treatments.  She previously saw dermatology and they wanted to hold off on oral medications given her autoimmune disorders.   Review of Systems  All other systems reviewed and are negative.  Past Medical History:  Diagnosis Date  . ANA positive   . Arthritis 2012   reactive arthritis  . Foot pain   . Hand pain   . High risk medication use   . History of kidney disease as a child   . Osteoarthritis    c spine  . Raynaud's disease   . Sacroiliitis ([place])   . Sicca complex ([place])   . Sjogren's syndrome ([place])   . Spondyloarthritis   . Ulcerative colitis [facility])     Past Surgical History:  Procedure Laterality Date  . COLONOSCOPY    . TUBAL LIGATION       Current Outpatient Medications:  .  acetaminophen (TYLENOL) 500 MG tablet, Take 1,000 mg by mouth every 12 (twelve) hours as needed., Disp: , Rfl:  .  Biotin (BIOTIN MAXIMUM STRENGTH) 10 MG TABS, Take 10 mg by mouth daily., Disp: , Rfl:  .  cetirizine (ZYRTEC) 10 MG tablet, Take 10 mg by mouth as needed. , Disp: , Rfl:  .  Cholecalciferol (VITAMIN D PO), Take 1 tablet by mouth daily., Disp: , Rfl:  .  clobetasol cream (TEMOVATE) 1.02 %, Apply 1 application topically 2 (two) times daily., Disp: 30 g, Rfl: 0 .  Cyanocobalamin (VITAMIN B-12 PO), Take 1 tablet by mouth daily., Disp: , Rfl:  .  diclofenac Sodium (VOLTAREN) 1 % GEL, Apply topically as needed., Disp: , Rfl:  .  folic acid (FOLVITE) 1 MG tablet, Take 1 mg by mouth daily., Disp: , Rfl:  .  ibuprofen  (ADVIL,MOTRIN) 200 MG tablet, Take 200 mg by mouth as needed., Disp: , Rfl:  .  inFLIXimab-dyyb (INFLECTRA IV), Inject into the vein., Disp: , Rfl:  .  L-THEANINE PO, Take by mouth at bedtime., Disp: , Rfl:  .  leucovorin (WELLCOVORIN) 5 MG tablet, leucovorin calcium 5 mg tablet, Disp: , Rfl:  .  MELATONIN PO, Take 2 mg by mouth daily., Disp: , Rfl:  .  methotrexate (RHEUMATREX) 10 MG tablet, Take 10 mg by mouth once a week. Caution: Chemotherapy. Protect from light., Disp: , Rfl:  .  NONFORMULARY OR COMPOUNDED ITEM, Antifungal solution: Terbinafine 3%, Fluconazole 2%, Tea Tree Oil 5%, Urea 10%, Ibuprofen 2% in DMSO suspension #74m, Disp: 1 each, Rfl: 3 .  predniSONE (DELTASONE) 10 MG tablet, Take 10 mg by mouth as needed., Disp: , Rfl:   Allergies  Allergen Reactions  . Iodine     Rash on face, coughing,        Objective:  Physical Exam  General: AAO x3, NAD  Dermatological: There is yellow to brown discoloration present in the nails particularly left hallux nail also with white discoloration.  There is no pain in the nails no redness or drainage or signs of infection.  Vascular: Dorsalis Pedis artery and Posterior  Tibial artery pedal pulses are 2/4 bilateral with immedate capillary fill time. There is no pain with calf compression, swelling, warmth, erythema.   Neruologic: Grossly intact via light touch bilateral.   Musculoskeletal: No gross boney pedal deformities bilateral. No pain, crepitus, or limitation noted with foot and ankle range of motion bilateral. Muscular strength 5/5 in all groups tested bilateral.  Gait: Unassisted, Nonantalgic.       Assessment:   44 year old female with onychomycosis    Plan:  -Treatment options discussed including all alternatives, risks, and complications -Etiology of symptoms were discussed -We discussed regards to treatment options.  Plan to hold off on Lamisil given other medications we discussed using a compound cream [facility] which I [facility].  She can continue the diluted vinegar soaks as well. Could consider fluconazole or laser as well.   Trula Slade DPM

## 2020-01-13 NOTE — Patient Instructions (Signed)
I have ordered a medication for you that will come from [facility] in [place]. They should be calling you to verify insurance and will mail the medication to you. If you live close by then you can go by their pharmacy to pick up the medication. Their phone number is [phone]. If you do not hear from them in the next few days, please give us a call at [phone].   

## 2020-02-23 ENCOUNTER — Encounter: Payer: 59 | Admitting: Nurse Practitioner

## 2020-04-17 ENCOUNTER — Ambulatory Visit: Payer: 59 | Admitting: Podiatry

## 2020-04-17 ENCOUNTER — Other Ambulatory Visit: Payer: Self-pay

## 2020-04-17 DIAGNOSIS — B351 Tinea unguium: Secondary | ICD-10-CM | POA: Diagnosis not present

## 2020-04-17 NOTE — Progress Notes (Signed)
Subjective: 45 year old female presents the office today for evaluation of toenail fungus and discoloration.  She has been using the topical compound cream which is been helpful for her over the last 3 months.  She does try to trim the nails on a regular basis to remove any nail that does not look healthy.  She has been on a biotin supplement for quite some time now. Denies any systemic complaints such as fevers, chills, nausea, vomiting. No acute changes since last appointment, and no other complaints at this time.   Objective: AAO x3, NAD DP/PT pulses palpable bilaterally, CRT less than 3 seconds Overall he currently notes much improvement there is still dystrophy.  The nails appear to be growing out.  She has been cut short.  There is no pain in the nails there is no redness or drainage or signs of infection. No pain with calf compression, swelling, warmth, erythema  Assessment: Onychomycosis with improvement  Plan: -All treatment options discussed with the patient including all alternatives, risks, complications.  -At this point discussed other treatment options.  She will continue with topical medication for the nail fungus.  She will consider starting laser therapy.  Continue biotin supplement. -Patient encouraged to call the office with any questions, concerns, change in symptoms.   Trula Slade DPM

## 2020-05-23 LAB — CBC AND DIFFERENTIAL
HCT: 38 (ref 36–46)
Hemoglobin: 12.2 (ref 12.0–16.0)
Neutrophils Absolute: 60.1
Platelets: 219 (ref 150–399)
WBC: 6

## 2020-05-23 LAB — BASIC METABOLIC PANEL
BUN: 14 (ref 4–21)
CO2: 19 (ref 13–22)
Chloride: 106 (ref 99–108)
Creatinine: 0.8 (ref 0.5–1.1)
Glucose: 100
Potassium: 4.1 (ref 3.4–5.3)
Sodium: 141 (ref 137–147)

## 2020-05-23 LAB — HEPATIC FUNCTION PANEL
ALT: 9 (ref 7–35)
AST: 13 (ref 13–35)
Bilirubin, Total: 57

## 2020-05-23 LAB — COMPREHENSIVE METABOLIC PANEL
Albumin: 4.5 (ref 3.5–5.0)
Calcium: 9.5 (ref 8.7–10.7)
Globulin: 3.1

## 2020-05-23 LAB — CBC: RBC: 4.27 (ref 3.87–5.11)

## 2020-05-23 LAB — POCT ERYTHROCYTE SEDIMENTATION RATE, NON-AUTOMATED: Sed Rate: 44

## 2020-09-11 LAB — HEPATIC FUNCTION PANEL
ALT: 18 (ref 7–35)
AST: 14 (ref 13–35)
Bilirubin, Total: 0.2

## 2020-09-11 LAB — CBC AND DIFFERENTIAL
HCT: 39 (ref 36–46)
Hemoglobin: 12.5 (ref 12.0–16.0)
Neutrophils Absolute: 3.5
Platelets: 277 (ref 150–399)
WBC: 5.7

## 2020-09-11 LAB — COMPREHENSIVE METABOLIC PANEL
Albumin: 4.2 (ref 3.5–5.0)
Calcium: 9.1 (ref 8.7–10.7)
GFR calc Af Amer: 99
GFR calc non Af Amer: 85
Globulin: 4

## 2020-09-11 LAB — BASIC METABOLIC PANEL
BUN: 16 (ref 4–21)
CO2: 23 — AB (ref 13–22)
Chloride: 102 (ref 99–108)
Creatinine: 0.8 (ref 0.5–1.1)
Glucose: 111
Potassium: 4.3 (ref 3.4–5.3)
Sodium: 139 (ref 137–147)

## 2020-09-11 LAB — POCT ERYTHROCYTE SEDIMENTATION RATE, NON-AUTOMATED: Sed Rate: 33

## 2020-09-11 LAB — CBC: RBC: 4.31 (ref 3.87–5.11)

## 2020-10-16 ENCOUNTER — Ambulatory Visit: Payer: 59 | Admitting: Podiatry

## 2020-11-21 ENCOUNTER — Ambulatory Visit: Payer: 59 | Admitting: Podiatry

## 2020-11-30 ENCOUNTER — Ambulatory Visit: Payer: 59 | Admitting: Podiatry

## 2020-11-30 ENCOUNTER — Other Ambulatory Visit: Payer: Self-pay

## 2020-11-30 DIAGNOSIS — B351 Tinea unguium: Secondary | ICD-10-CM | POA: Diagnosis not present

## 2020-12-03 NOTE — Progress Notes (Signed)
Subjective: 45 year old female presents the office today for evaluation of toenail fungus and discoloration.  She states this is back in the topical compound cream through [place] which has been helpful previously.  She has no pain in the nails and denies any swelling or redness.  She has no other concerns today.  Objective: AAO x3, NAD DP/PT pulses palpable bilaterally, CRT less than 3 seconds Is difficult to see the nails at this point but may cut back short.  The nail that I can evaluate today is dystrophic and hypertrophic.  There is no edema, erythema, drainage or pus or any obvious signs of infection.  There is no open lesions. No pain with calf compression, swelling, warmth, erythema  Assessment: Onychomycosis   Plan: -All treatment options discussed with the patient including all alternatives, risks, complications.  -We discussed treatment options including oral as well as topical. For now continue topical.  I sent a new prescription to [facility] in order to increase the dose of the Lamisil percentage and eliminate the urea. -We discussed oral Lamisil but given her other medications are likely discussed with her rheumatologist.  We will consider this in the future if needed.  Trula Slade DPM  Cc: Dr. Kathlene November -At this point discussed other treatment options.  She will continue with topical medication for the nail fungus.  She will consider starting laser therapy.  Continue biotin supplement. -Patient encouraged to call the office with any questions, concerns, change in symptoms.   Trula Slade DPM

## 2020-12-13 ENCOUNTER — Telehealth: Payer: Self-pay | Admitting: Podiatry

## 2020-12-13 NOTE — Telephone Encounter (Signed)
Tried to call the patient to let her know that the compound (anti-fungal ) nail lacquer does not have the Urea in it and the patient's phone stated that she could not take a call at this time. [name]

## 2020-12-13 NOTE — Telephone Encounter (Signed)
Patient called and stated that she was suppose to have a compound sent to [facility] but she wanted to make sure the prescription is sent without the urea.   Please advise

## 2020-12-29 ENCOUNTER — Ambulatory Visit: Payer: 59 | Admitting: Nurse Practitioner

## 2020-12-29 ENCOUNTER — Encounter: Payer: Self-pay | Admitting: Nurse Practitioner

## 2020-12-29 ENCOUNTER — Other Ambulatory Visit: Payer: Self-pay

## 2020-12-29 VITALS — BP 124/82 | HR 84 | Temp 97.7°F | Ht 65.0 in | Wt 141.0 lb

## 2020-12-29 DIAGNOSIS — R030 Elevated blood-pressure reading, without diagnosis of hypertension: Secondary | ICD-10-CM

## 2020-12-29 DIAGNOSIS — F411 Generalized anxiety disorder: Secondary | ICD-10-CM | POA: Diagnosis not present

## 2020-12-29 DIAGNOSIS — F5101 Primary insomnia: Secondary | ICD-10-CM

## 2020-12-29 DIAGNOSIS — M069 Rheumatoid arthritis, unspecified: Secondary | ICD-10-CM | POA: Diagnosis not present

## 2020-12-29 MED ORDER — SERTRALINE HCL 50 MG PO TABS: ORAL_TABLET | ORAL | 1 refills | Status: DC

## 2020-12-29 NOTE — Progress Notes (Signed)
Careteam: Patient Care Team: Lauree Chandler, NP as PCP - General (Nurse Practitioner)  PLACE OF SERVICE:  [place]  Advanced Directive information    Allergies  Allergen Reactions   Iodine     Rash on face, coughing,    Chief Complaint  Patient presents with   Acute Visit    Elevated blood pressure off/on. Patient had headaches around the time of elevated blood pressure and not sure if related to elevated b/p episodes. Discuss sleep issues, melatonin is not helping.      HPI: Patient is a 45 y.o. female for elevated blood pressure.   Reports first week of October was having pain in her head- checked her blood pressure and got scared. Continued to monitor bp.  Blood pressure ranging from 107-141/80-92. Mostly <130/90.  She has been trying to work on drinking more water and reducing her sodium.   She is active but has not been doing her exercise.  Unable to jog but can walk- has not been walking.  Bought a stationary bike.   Feels like headaches may be due to stress and anxiety.  Talked about starting medication but she wanted to try therapist.  She could not find one that was taking appt.  She did not want to add to her medication but feel like she needs something at this time.  Anxiety is bad. Overthinking at night.   Labs have been taken routinely by rheumatologist.   Review of Systems:  Review of Systems  Constitutional:  Negative for chills, fever and weight loss.  HENT:  Negative for tinnitus.   Respiratory:  Negative for cough, sputum production and shortness of breath.   Cardiovascular:  Negative for chest pain, palpitations and leg swelling.  Gastrointestinal:  Negative for abdominal pain, constipation, diarrhea and heartburn.  Genitourinary:  Negative for dysuria, frequency and urgency.  Musculoskeletal:  Positive for back pain, joint pain and myalgias. Negative for falls.  Skin: Negative.   Neurological:  Positive for headaches. Negative for  dizziness.  Psychiatric/Behavioral:  Negative for depression and memory loss. The patient is nervous/anxious and has insomnia.    Past Medical History:  Diagnosis Date   ANA positive    Arthritis 2012   reactive arthritis   Foot pain    Hand pain    High risk medication use    History of kidney disease as a child    Osteoarthritis    c spine   Raynaud's disease    Sacroiliitis ([place])    Sicca complex ([place])    Sjogren's syndrome ([place])    Spondyloarthritis    Ulcerative colitis ([place])    Past Surgical History:  Procedure Laterality Date   COLONOSCOPY     TUBAL LIGATION     Social History:   reports that she has never smoked. She has never used smokeless tobacco. She reports current alcohol use. She reports that she does not use drugs.  Family History  Problem Relation Age of Onset   Cancer Mother 38       Breast   Hypertension Mother    Cancer Father 19       bone   Ulcerative colitis Brother 68   Diabetes Brother    Gallbladder disease Daughter    Diabetes Maternal Grandmother    Stomach cancer Maternal Grandmother    Colon cancer Neg Hx    Colon polyps Neg Hx    Esophageal cancer Neg Hx    Rectal cancer Neg Hx  Medications: Patient's Medications  New Prescriptions   No medications on file  Previous Medications   ACETAMINOPHEN (TYLENOL) 500 MG TABLET    Take 1,000 mg by mouth every 12 (twelve) hours as needed.   BIOTIN 10 MG TABS    Take 10 mg by mouth daily.   CETIRIZINE (ZYRTEC) 10 MG TABLET    Take 10 mg by mouth as needed.    CHOLECALCIFEROL (VITAMIN D PO)    Take 1 tablet by mouth daily.   CLOBETASOL CREAM (TEMOVATE) 0.05 %    Apply 1 application topically 2 (two) times daily.   CYANOCOBALAMIN (VITAMIN B-12 PO)    Take 1 tablet by mouth daily.   DICLOFENAC SODIUM (VOLTAREN) 1 % GEL    Apply topically as needed.   FOLIC ACID (FOLVITE) 1 MG TABLET    Take 1 mg by mouth daily.   IBUPROFEN (ADVIL,MOTRIN) 200 MG TABLET    Take 200 mg by mouth as needed.    INFLIXIMAB-AXXQ (AVSOLA) 100 MG INJECTION    Inject into the vein every 8 (eight) weeks.   L-THEANINE PO    Take by mouth at bedtime.   LEUCOVORIN (WELLCOVORIN) 5 MG TABLET    leucovorin calcium 5 mg tablet   MELATONIN PO    Take 2 mg by mouth daily.   METHOTREXATE (RHEUMATREX) 10 MG TABLET    Take 10 mg by mouth once a week. Caution: Chemotherapy. Protect from light.   NON FORMULARY    [employer]  Anti-fungal (nail)-#1 increase the terbinafine from 3% to 5% per Dr Jacqualyn Posey   NORETHINDRONE-ETHINYL ESTRADIOL-FE BIPHAS (LO LOESTRIN FE) 1 MG-10 MCG / 10 MCG TABLET    Take 1 tablet by mouth daily.   PREDNISONE (DELTASONE) 10 MG TABLET    Take 10 mg by mouth as needed.  Modified Medications   No medications on file  Discontinued Medications   INFLIXIMAB-DYYB (INFLECTRA IV)    Inject into the vein.   NONFORMULARY OR COMPOUNDED ITEM    Antifungal solution: Terbinafine 3%, Fluconazole 2%, Tea Tree Oil 5%, Urea 10%, Ibuprofen 2% in DMSO suspension #48m    Physical Exam:  Vitals:   12/29/20 0824  BP: 124/82  Pulse: 84  Temp: 97.7 F (36.5 C)  TempSrc: Temporal  SpO2: 99%  Weight: 141 lb (64 kg)  Height: 5' 5"  (1.651 m)   Body mass index is 23.46 kg/m. Wt Readings from Last 3 Encounters:  12/29/20 141 lb (64 kg)  07/16/19 136 lb (61.7 kg)  02/19/19 143 lb (64.9 kg)    Physical Exam Constitutional:      General: She is not in acute distress.    Appearance: She is well-developed. She is not diaphoretic.  HENT:     Head: Normocephalic and atraumatic.     Mouth/Throat:     Pharynx: No oropharyngeal exudate.  Eyes:     Conjunctiva/sclera: Conjunctivae normal.     Pupils: Pupils are equal, round, and reactive to light.  Cardiovascular:     Rate and Rhythm: Normal rate and regular rhythm.     Heart sounds: Normal heart sounds.  Pulmonary:     Effort: Pulmonary effort is normal.     Breath sounds: Normal breath sounds.  Abdominal:     General: Bowel sounds are normal.      Palpations: Abdomen is soft.  Musculoskeletal:     Cervical back: Normal range of motion and neck supple.     Right lower leg: No edema.  Left lower leg: No edema.  Skin:    General: Skin is warm and dry.  Neurological:     Mental Status: She is alert and oriented to person, place, and time.  Psychiatric:        Mood and Affect: Mood normal.    Labs reviewed: Basic Metabolic Panel: No results for input(s): NA, K, CL, CO2, GLUCOSE, BUN, CREATININE, CALCIUM, MG, PHOS, TSH in the last 8760 hours. Liver Function Tests: No results for input(s): AST, ALT, ALKPHOS, BILITOT, PROT, ALBUMIN in the last 8760 hours. No results for input(s): LIPASE, AMYLASE in the last 8760 hours. No results for input(s): AMMONIA in the last 8760 hours. CBC: No results for input(s): WBC, NEUTROABS, HGB, HCT, MCV, PLT in the last 8760 hours. Lipid Panel: No results for input(s): CHOL, HDL, LDLCALC, TRIG, CHOLHDL, LDLDIRECT in the last 8760 hours. TSH: No results for input(s): TSH in the last 8760 hours. A1C: No results found for: HGBA1C   Assessment/Plan 1. Anxiety state -ongoing, ready to start medication at this time. No SI or HI - sertraline (ZOLOFT) 50 MG tablet; To start half tablet for 2 weeks then increase to 1 tablet  Dispense: 30 tablet; Refill: 1 Pt educated If you start having thoughts of hurting yourself or others after starting this medicine, please call immediately.     2. Rheumatoid arthritis involving multiple sites, unspecified whether rheumatoid factor present (HCC) Stable, using tylenol with ibuprofen if needed. Does well until her infusion starts to wear off. This can also effect her sleep  3. Primary insomnia Information provided about sleep habits. Continue on melatonin. Hopefully controlling anxiety will help with sleep.   4. Elevated blood pressure reading On home bp monitor. Encouraged to make dietary changes at this time and continue to monitor occasionally. Well controlled  in office.     Next appt: 6 weeks, sooner if needed Harleen Fineberg K. [place], [facility] [phone]

## 2020-12-29 NOTE — Patient Instructions (Signed)
Start half tablet of zoloft for 2 weeks then increase to 1 tablet Can take in the morning or in the evening.   Continue tylenol 1000 mg by mouth every 8 hours as needed   Look into cognitive behavioral therapy for sleep if needed Continue sleep routines and relaxation methods.

## 2021-02-16 ENCOUNTER — Other Ambulatory Visit: Payer: Self-pay

## 2021-02-16 ENCOUNTER — Ambulatory Visit (INDEPENDENT_AMBULATORY_CARE_PROVIDER_SITE_OTHER): Payer: 59 | Admitting: Nurse Practitioner

## 2021-02-16 ENCOUNTER — Encounter: Payer: Self-pay | Admitting: Nurse Practitioner

## 2021-02-16 VITALS — BP 126/80 | HR 87 | Temp 97.7°F | Ht 65.0 in | Wt 140.0 lb

## 2021-02-16 DIAGNOSIS — F5101 Primary insomnia: Secondary | ICD-10-CM

## 2021-02-16 DIAGNOSIS — F411 Generalized anxiety disorder: Secondary | ICD-10-CM | POA: Diagnosis not present

## 2021-02-16 DIAGNOSIS — M069 Rheumatoid arthritis, unspecified: Secondary | ICD-10-CM

## 2021-02-16 MED ORDER — SERTRALINE HCL 50 MG PO TABS: 50.0000 mg | ORAL_TABLET | Freq: Every day | ORAL | 1 refills | Status: DC

## 2021-02-16 NOTE — Progress Notes (Signed)
Careteam: Patient Care Team: Laura Chandler, NP as PCP - General (Nurse Practitioner)  PLACE OF SERVICE:  [place]  Advanced Directive information    Allergies  Allergen Reactions   Iodine     Rash on face, coughing,    Chief Complaint  Patient presents with   Follow-up    6 week follow-up on depression and sleep concerns. Discuss need for hep c screening, colonoscopy, td/tdap, and covid vaccine or postpone if patient refuses.      HPI: Patient is a 45 y.o. female for follow up on anxiety/sleep. In the beginning she felt nauseated and loopy but that went away after a few days.  The difference is big.  She feels like she was depressed with high anxiety and now things are much better. She is doing better getting outside, going places. Small task were overwhelming for her.  She can tell a major difference.   Review of Systems:  Review of Systems  Constitutional:  Negative for chills, fever and weight loss.  HENT:  Negative for tinnitus.   Respiratory:  Negative for cough, sputum production and shortness of breath.   Cardiovascular:  Negative for chest pain, palpitations and leg swelling.  Gastrointestinal:  Negative for abdominal pain, constipation, diarrhea and heartburn.  Genitourinary:  Negative for dysuria, frequency and urgency.  Musculoskeletal:  Positive for joint pain and myalgias. Negative for back pain and falls.  Skin: Negative.   Neurological:  Negative for dizziness and headaches.  Psychiatric/Behavioral:  Negative for depression and memory loss. The patient does not have insomnia.    Past Medical History:  Diagnosis Date   ANA positive    Arthritis 2012   reactive arthritis   Foot pain    Hand pain    High risk medication use    History of kidney disease as a child    Osteoarthritis    c spine   Raynaud's disease    Sacroiliitis ([place])    Sicca complex ([place])    Sjogren's syndrome ([place])    Spondyloarthritis    Ulcerative colitis ([place])     Past Surgical History:  Procedure Laterality Date   COLONOSCOPY     TUBAL LIGATION     Social History:   reports that she has never smoked. She has never used smokeless tobacco. She reports current alcohol use. She reports that she does not use drugs.  Family History  Problem Relation Age of Onset   Cancer Mother 52       Breast   Hypertension Mother    Cancer Father 23       bone   Ulcerative colitis Brother 12   Diabetes Brother    Gallbladder disease Daughter    Diabetes Maternal Grandmother    Stomach cancer Maternal Grandmother    Colon cancer Neg Hx    Colon polyps Neg Hx    Esophageal cancer Neg Hx    Rectal cancer Neg Hx     Medications: Patient's Medications  New Prescriptions   No medications on file  Previous Medications   ACETAMINOPHEN (TYLENOL) 500 MG TABLET    Take 1,000 mg by mouth every 12 (twelve) hours as needed.   BIOTIN 10 MG TABS    Take 10 mg by mouth daily.   CETIRIZINE (ZYRTEC) 10 MG TABLET    Take 10 mg by mouth as needed.    CHOLECALCIFEROL (VITAMIN D PO)    Take 1 tablet by mouth daily.   CYANOCOBALAMIN (VITAMIN B-12  PO)    Take 1 tablet by mouth daily.   DICLOFENAC SODIUM (VOLTAREN) 1 % GEL    Apply topically as needed.   FOLIC ACID (FOLVITE) 1 MG TABLET    Take 1 mg by mouth daily.   IBUPROFEN (ADVIL,MOTRIN) 200 MG TABLET    Take 200 mg by mouth as needed.   INFLIXIMAB-AXXQ (AVSOLA) 100 MG INJECTION    Inject into the vein every 8 (eight) weeks.   LEUCOVORIN (WELLCOVORIN) 5 MG TABLET    leucovorin calcium 5 mg tablet   MELATONIN PO    Take 2 mg by mouth daily.   METHOTREXATE (RHEUMATREX) 10 MG TABLET    Take 10 mg by mouth once a week. Caution: Chemotherapy. Protect from light.   NON FORMULARY    [employer]  Anti-fungal (nail)-#1 increase the terbinafine from 3% to 5% per Dr Jacqualyn Posey   NORETHINDRONE-ETHINYL ESTRADIOL-FE BIPHAS (LO LOESTRIN FE) 1 MG-10 MCG / 10 MCG TABLET    Take 1 tablet by mouth daily.   PREDNISONE (DELTASONE)  10 MG TABLET    Take 10 mg by mouth as needed.   SERTRALINE (ZOLOFT) 50 MG TABLET    To start half tablet for 2 weeks then increase to 1 tablet  Modified Medications   No medications on file  Discontinued Medications   CLOBETASOL CREAM (TEMOVATE) 0.05 %    Apply 1 application topically 2 (two) times daily.   INFLIXIMAB (REMICADE) 100 MG INJECTION    Inject into the vein. Notes: 7m/kg   L-THEANINE PO    Take by mouth at bedtime.    Physical Exam:  Vitals:   02/16/21 1522  BP: 126/80  Pulse: 87  Temp: 97.7 F (36.5 C)  TempSrc: Temporal  SpO2: 98%  Weight: 140 lb (63.5 kg)  Height: 5' 5"  (1.651 m)   Body mass index is 23.3 kg/m. Wt Readings from Last 3 Encounters:  02/16/21 140 lb (63.5 kg)  12/29/20 141 lb (64 kg)  07/16/19 136 lb (61.7 kg)    Physical Exam Constitutional:      Appearance: Normal appearance.  Skin:    General: Skin is warm and dry.  Neurological:     Mental Status: She is alert and oriented to person, place, and time. Mental status is at baseline.  Psychiatric:        Mood and Affect: Mood normal.    Labs reviewed: Basic Metabolic Panel: Recent Labs    05/23/20 0000 09/11/20 0000  NA 141 139  K 4.1 4.3  CL 106 102  CO2 19 23*  BUN 14 16  CREATININE 0.8 0.8  CALCIUM 9.5 9.1   Liver Function Tests: Recent Labs    05/23/20 0000 09/11/20 0000  AST 13 14  ALT 9 18  ALBUMIN 4.5 4.2   No results for input(s): LIPASE, AMYLASE in the last 8760 hours. No results for input(s): AMMONIA in the last 8760 hours. CBC: Recent Labs    05/23/20 0000 09/11/20 0000  WBC 6.0 5.7  NEUTROABS 60.10 3.50  HGB 12.2 12.5  HCT 38 39  PLT 219 277   Lipid Panel: No results for input(s): CHOL, HDL, LDLCALC, TRIG, CHOLHDL, LDLDIRECT in the last 8760 hours. TSH: No results for input(s): TSH in the last 8760 hours. A1C: No results found for: HGBA1C   Assessment/Plan 1. Anxiety state -doing much better since on zoloft, continue lifestyle  modification as well as medication.  - sertraline (ZOLOFT) 50 MG tablet; Take 1 tablet (50 mg total)  by mouth daily.  Dispense: 90 tablet; Refill: 1  2. Primary insomnia Improved with treating anxiety.   3. Rheumatoid arthritis involving multiple sites, unspecified whether rheumatoid factor present ([place]) -discussed use of zoloft with advil. She only uses occasionally and will use close to time of her injection   Next appt: yearly  Shirelle Tootle K. [place], [facility] [phone]

## 2021-03-01 ENCOUNTER — Ambulatory Visit: Payer: 59 | Admitting: Podiatry

## 2021-03-09 ENCOUNTER — Ambulatory Visit: Payer: 59 | Admitting: Podiatry

## 2021-03-26 ENCOUNTER — Other Ambulatory Visit: Payer: Self-pay

## 2021-03-26 ENCOUNTER — Ambulatory Visit: Payer: 59 | Admitting: Podiatry

## 2021-03-26 DIAGNOSIS — B351 Tinea unguium: Secondary | ICD-10-CM | POA: Diagnosis not present

## 2021-03-28 NOTE — Progress Notes (Signed)
Subjective: 46 year old female presents the office today for evaluation of toenail fungus and discoloration.  She states the nails have grown about halfway.  Previously she did not have much toenail there still is starting to grow out.  She is doing well at the [facility] nail fungus when they are without the area.  No pain in the nails no swelling or redness or drainage and she has no other concerns today.   Objective: AAO x3, NAD DP/PT pulses palpable bilaterally, CRT less than 3 seconds The nails have grown out about halfway and appears to be much better compared to previous.  There is still thickened somewhat discolored with yellow, brown discoloration but no hyperpigmented changes.  No pain to the nails and no swelling redness or drainage. No pain with calf compression, swelling, warmth, erythema  Assessment: Onychomycosis -the nails are growing more  Plan: -All treatment options discussed with the patient including all alternatives, risks, complications.  -Continue the current topical antifungal through [place] without urea.  For now continue to allow the nails nails to grow out we also discussed biotin supplements. -In the future discussed other topicals including topical steroids/antifungals.  Return if symptoms worsen or fail to improve.  Trula Slade DPM

## 2021-04-05 ENCOUNTER — Encounter: Payer: Self-pay | Admitting: Gastroenterology

## 2021-09-13 ENCOUNTER — Other Ambulatory Visit: Payer: Self-pay

## 2021-09-13 DIAGNOSIS — F411 Generalized anxiety disorder: Secondary | ICD-10-CM

## 2021-09-13 MED ORDER — SERTRALINE HCL 50 MG PO TABS: 50.0000 mg | ORAL_TABLET | Freq: Every day | ORAL | 1 refills | Status: DC

## 2021-09-13 NOTE — Telephone Encounter (Signed)
Patient called to make apnt for fasting labs and annual follow up because her prescription for sertraline was denied. She is requesting it to be approved now and sent to [facility] [address]. She has 1 pill remaining.

## 2021-10-02 ENCOUNTER — Other Ambulatory Visit: Payer: Self-pay

## 2021-10-02 ENCOUNTER — Other Ambulatory Visit: Payer: 59

## 2021-10-02 ENCOUNTER — Other Ambulatory Visit: Payer: Self-pay | Admitting: Nurse Practitioner

## 2021-10-02 DIAGNOSIS — Z0000 Encounter for general adult medical examination without abnormal findings: Secondary | ICD-10-CM

## 2021-10-02 LAB — COMPLETE METABOLIC PANEL WITH GFR
AG Ratio: 1.3 (calc) (ref 1.0–2.5)
ALT: 9 U/L (ref 6–29)
AST: 12 U/L (ref 10–35)
Albumin: 4.4 g/dL (ref 3.6–5.1)
Alkaline phosphatase (APISO): 39 U/L (ref 31–125)
BUN: 19 mg/dL (ref 7–25)
CO2: 24 mmol/L (ref 20–32)
Calcium: 9.5 mg/dL (ref 8.6–10.2)
Chloride: 106 mmol/L (ref 98–110)
Creat: 0.74 mg/dL (ref 0.50–0.99)
Globulin: 3.5 g/dL (calc) (ref 1.9–3.7)
Glucose, Bld: 92 mg/dL (ref 65–99)
Potassium: 4.4 mmol/L (ref 3.5–5.3)
Sodium: 137 mmol/L (ref 135–146)
Total Bilirubin: 0.4 mg/dL (ref 0.2–1.2)
Total Protein: 7.9 g/dL (ref 6.1–8.1)
eGFR: 101 mL/min/{1.73_m2} (ref >=60)

## 2021-10-02 LAB — CBC WITH DIFFERENTIAL/PLATELET
Absolute Monocytes: 391 cells/uL (ref 200–950)
Basophils Absolute: 32 cells/uL (ref 0–200)
Basophils Relative: 0.7 %
Eosinophils Absolute: 129 cells/uL (ref 15–500)
Eosinophils Relative: 2.8 %
HCT: 39.2 % (ref 35.0–45.0)
Hemoglobin: 12.7 g/dL (ref 11.7–15.5)
Lymphs Abs: 1058 cells/uL (ref 850–3900)
MCH: 30.1 pg (ref 27.0–33.0)
MCHC: 32.4 g/dL (ref 32.0–36.0)
MCV: 92.9 fL (ref 80.0–100.0)
MPV: 11.3 fL (ref 7.5–12.5)
Monocytes Relative: 8.5 %
Neutro Abs: 2990 cells/uL (ref 1500–7800)
Neutrophils Relative %: 65 %
Platelets: 255 10*3/uL (ref 140–400)
RBC: 4.22 10*6/uL (ref 3.80–5.10)
RDW: 13.6 % (ref 11.0–15.0)
Total Lymphocyte: 23 %
WBC: 4.6 10*3/uL (ref 3.8–10.8)

## 2021-10-02 LAB — LIPID PANEL
Cholesterol: 224 mg/dL — ABNORMAL HIGH (ref <200)
HDL: 62 mg/dL (ref >=50)
LDL Cholesterol (Calc): 132 mg/dL (calc) — ABNORMAL HIGH
Non-HDL Cholesterol (Calc): 162 mg/dL (calc) — ABNORMAL HIGH (ref <130)
Total CHOL/HDL Ratio: 3.6 (calc) (ref <5.0)
Triglycerides: 161 mg/dL — ABNORMAL HIGH (ref <150)

## 2021-10-04 NOTE — Progress Notes (Signed)
Provider: Lauree Chandler, NP  Patient Care Team: Lauree Chandler, NP as PCP - General (Nurse Practitioner)  Extended Emergency Contact Information Primary Emergency Contact: Reyes,Josue Address: [address]          [place], [place] [address] Johnnette Litter of [place] Phone: [phone] Mobile Phone: [phone] Relation: Spouse Allergies  Allergen Reactions   Iodine     Rash on face, coughing,   Code Status: FULL Goals of Care: Advanced Directive information    10/05/2021    9:55 AM  Advanced Directives  Does Patient Have a Medical Advance Directive? No  Would patient like information on creating a medical advance directive? Yes (MAU/Ambulatory/Procedural Areas - Information given)     Chief Complaint  Patient presents with   Annual Exam    Yearly physical. Discuss need for hep c screening , td/tdap, covid boosters, colonoscopy and mammogram. Discuss dosage on supplements     HPI: Patient is a 46 y.o. female seen in today for an wellness exam at [facility]  Reports a little exercise when joints allow her.  Trying to stretch daily  Trying to watch diet but feel like it is getting worse. Feels like she need to meet with a nutritionist to help minimize inflammation with diet.   Followed with Dr Ardis Hughs, GI for colonoscopy (due at this time)  Seeing GYN- august for PAP and mammogram.   Labs reviewed with pt     10/05/2021    9:55 AM 02/16/2021    3:21 PM 07/16/2019   11:47 AM 02/19/2019    1:33 PM 01/28/2018    1:30 PM  Depression screen PHQ 2/9  Decreased Interest 0 0 0 0 0  Down, Depressed, Hopeless 0 0 0 0 0  PHQ - 2 Score 0 0 0 0 0       01/28/2018    1:30 PM 02/19/2019    1:32 PM 07/16/2019   11:46 AM 02/16/2021    3:21 PM 10/05/2021    9:54 AM  Fall Risk  Falls in the past year? 0 1 0 0 0  Was there an injury with Fall?  0 0 0 0  Fall Risk Category Calculator  1 0 0 0  Fall Risk Category  Low Low Low Low  Patient Fall Risk Level  Low fall  risk Low fall risk Low fall risk Low fall risk  Patient at Risk for Falls Due to    No Fall Risks No Fall Risks  Fall risk Follow up    Falls evaluation completed Falls evaluation completed       No data to display           Health Maintenance  Topic Date Due   Hepatitis C Screening  Never done   TETANUS/TDAP  Never done   COVID-19 Vaccine (6 - Booster for [place] series) 10/03/2020   COLONOSCOPY (Pts 45-49yr Insurance coverage will need to be confirmed)  12/22/2020   MAMMOGRAM  09/04/2021   HIV Screening  10/16/2023 (Originally 08/06/1990)   INFLUENZA VACCINE  10/16/2021   PAP SMEAR-Modifier  09/05/2023   HPV VACCINES  Aged Out    Past Medical History:  Diagnosis Date   ANA positive    Arthritis 2012   reactive arthritis   Foot pain    Hand pain    High risk medication use    History of kidney disease as a child    Osteoarthritis    c spine   Raynaud's disease  Sacroiliitis ([place])    Sicca complex ([place])    Sjogren's syndrome ([place])    Spondyloarthritis    Ulcerative colitis ([place])     Past Surgical History:  Procedure Laterality Date   COLONOSCOPY     TUBAL LIGATION      Social History   Socioeconomic History   Marital status: Married    Spouse name: Not on file   Number of children: Not on file   Years of education: Not on file   Highest education level: Not on file  Occupational History   Not on file  Tobacco Use   Smoking status: Never   Smokeless tobacco: Never  Vaping Use   Vaping Use: Never used  Substance and Sexual Activity   Alcohol use: Yes    Alcohol/week: 0.0 standard drinks of alcohol    Comment: occ glass of wine   Drug use: No   Sexual activity: Yes    Birth control/protection: Surgical  Other Topics Concern   Not on file  Social History Narrative   Not on file   Social Determinants of Health   Financial Resource Strain: Not on file  Food Insecurity: Not on file  Transportation Needs: Not on file  Physical Activity: Not  on file  Stress: Not on file  Social Connections: Not on file    Family History  Problem Relation Age of Onset   Cancer Mother 12       Breast   Hypertension Mother    Cancer Father 56       bone   Ulcerative colitis Brother 87   Diabetes Brother    Gallbladder disease Daughter    Diabetes Maternal Grandmother    Stomach cancer Maternal Grandmother    Colon cancer Neg Hx    Colon polyps Neg Hx    Esophageal cancer Neg Hx    Rectal cancer Neg Hx     Review of Systems:  Review of Systems  Constitutional:  Negative for chills, fever and weight loss.  HENT:  Negative for tinnitus.   Respiratory:  Negative for cough, sputum production and shortness of breath.   Cardiovascular:  Negative for chest pain, palpitations and leg swelling.  Gastrointestinal:  Negative for abdominal pain, constipation, diarrhea and heartburn.  Genitourinary:  Negative for dysuria, frequency and urgency.  Musculoskeletal:  Positive for joint pain. Negative for back pain, falls and myalgias.  Skin: Negative.   Neurological:  Negative for dizziness and headaches.  Psychiatric/Behavioral:  Negative for depression and memory loss. The patient does not have insomnia.      Allergies as of 10/05/2021       Reactions   Iodine    Rash on face, coughing,        Medication List        Accurate as of October 05, 2021 10:05 AM. If you have any questions, ask your nurse or doctor.          acetaminophen 500 MG tablet Commonly known as: TYLENOL Take 1,000 mg by mouth every 12 (twelve) hours as needed.   Avsola 100 MG injection Generic drug: inFLIXimab-axxq Inject into the vein every 8 (eight) weeks.   Biotin 10 MG Tabs Take 10 mg by mouth daily.   cetirizine 10 MG tablet Commonly known as: ZYRTEC Take 10 mg by mouth as needed.   diclofenac Sodium 1 % Gel Commonly known as: VOLTAREN Apply topically as needed.   folic acid 1 MG tablet Commonly known as: FOLVITE Take 1 mg  by mouth daily.    ibuprofen 200 MG tablet Commonly known as: ADVIL Take 200 mg by mouth as needed.   leucovorin 5 MG tablet Commonly known as: WELLCOVORIN leucovorin calcium 5 mg tablet   Lo Loestrin Fe 1 MG-10 MCG / 10 MCG tablet Generic drug: Norethindrone-Ethinyl Estradiol-Fe Biphas Take 1 tablet by mouth daily.   MELATONIN PO Take 2 mg by mouth daily.   methotrexate 10 MG tablet Commonly known as: RHEUMATREX Take 10 mg by mouth once a week. Caution: Chemotherapy. Protect from light.   NON FORMULARY [employer]  Anti-fungal (nail)-#1 increase the terbinafine from 3% to 5% per Dr Jacqualyn Posey   predniSONE 10 MG tablet Commonly known as: DELTASONE Take 10 mg by mouth as needed.   sertraline 50 MG tablet Commonly known as: Zoloft Take 1 tablet (50 mg total) by mouth daily.   VITAMIN B-12 PO Take 1 tablet by mouth daily.   VITAMIN D PO Take 1 tablet by mouth daily.          Physical Exam: Vitals:   10/05/21 0811  BP: 128/90  Pulse: 67  Temp: (!) 97.3 F (36.3 C)  TempSrc: Temporal  SpO2: 99%  Weight: 147 lb (66.7 kg)  Height: 5' 5"  (1.651 m)   Body mass index is 24.46 kg/m. Wt Readings from Last 3 Encounters:  10/05/21 147 lb (66.7 kg)  02/16/21 140 lb (63.5 kg)  12/29/20 141 lb (64 kg)    Physical Exam Constitutional:      General: She is not in acute distress.    Appearance: She is well-developed. She is not diaphoretic.  HENT:     Head: Normocephalic and atraumatic.     Mouth/Throat:     Pharynx: No oropharyngeal exudate.  Eyes:     Conjunctiva/sclera: Conjunctivae normal.     Pupils: Pupils are equal, round, and reactive to light.  Cardiovascular:     Rate and Rhythm: Normal rate and regular rhythm.     Heart sounds: Normal heart sounds.  Pulmonary:     Effort: Pulmonary effort is normal.     Breath sounds: Normal breath sounds.  Abdominal:     General: Bowel sounds are normal.     Palpations: Abdomen is soft.  Musculoskeletal:     Cervical  back: Normal range of motion and neck supple.     Right lower leg: No edema.     Left lower leg: No edema.  Skin:    General: Skin is warm and dry.  Neurological:     Mental Status: She is alert.  Psychiatric:        Mood and Affect: Mood normal.     Labs reviewed: Basic Metabolic Panel: Recent Labs    10/02/21 0916  NA 137  K 4.4  CL 106  CO2 24  GLUCOSE 92  BUN 19  CREATININE 0.74  CALCIUM 9.5   Liver Function Tests: Recent Labs    10/02/21 0916  AST 12  ALT 9  BILITOT 0.4  PROT 7.9   No results for input(s): "LIPASE", "AMYLASE" in the last 8760 hours. No results for input(s): "AMMONIA" in the last 8760 hours. CBC: Recent Labs    10/02/21 0916  WBC 4.6  NEUTROABS 2,990  HGB 12.7  HCT 39.2  MCV 92.9  PLT 255   Lipid Panel: Recent Labs    10/02/21 0916  CHOL 224*  HDL 62  LDLCALC 132*  TRIG 161*  CHOLHDL 3.6   No results found for: "HGBA1C"  Procedures: No results found.  Assessment/Plan 1. Preventative health care Completed today -PREVENTIVE COUNSELING:  The patient was counseled regarding the appropriate use of alcohol, regular self-examination of the breasts on a monthly basis, prevention of dental and periodontal disease, diet, regular sustained exercise for at least 30 minutes 5 times per week, routine screening interval for mammogram as recommended by the [place] and ACOG, importance of regular PAP smears and recommended schedule for GI hemoccult testing, colonoscopy, cholesterol, thyroid and diabetes screening.  2. Anxiety state Controlled on zoloft.   3. Rheumatoid arthritis involving multiple sites, unspecified whether rheumatoid factor present ([place]) Followed by rheumatology.   4. Hyperlipidemia, unspecified hyperlipidemia type -dietary modifications discussed   Next appt: yearly  [place] Mckimmy [facility] [place], [facility] [phone]

## 2021-10-05 ENCOUNTER — Ambulatory Visit (INDEPENDENT_AMBULATORY_CARE_PROVIDER_SITE_OTHER): Payer: 59 | Admitting: Nurse Practitioner

## 2021-10-05 ENCOUNTER — Encounter: Payer: Self-pay | Admitting: Nurse Practitioner

## 2021-10-05 VITALS — BP 128/90 | HR 67 | Temp 97.3°F | Ht 65.0 in | Wt 147.0 lb

## 2021-10-05 DIAGNOSIS — E785 Hyperlipidemia, unspecified: Secondary | ICD-10-CM

## 2021-10-05 DIAGNOSIS — F411 Generalized anxiety disorder: Secondary | ICD-10-CM

## 2021-10-05 DIAGNOSIS — Z0000 Encounter for general adult medical examination without abnormal findings: Secondary | ICD-10-CM

## 2021-10-05 DIAGNOSIS — M069 Rheumatoid arthritis, unspecified: Secondary | ICD-10-CM

## 2021-10-05 NOTE — Patient Instructions (Signed)
Please sign a record release for GYN on check out for mammogram and pap

## 2021-10-31 ENCOUNTER — Ambulatory Visit: Payer: 59 | Admitting: Gastroenterology

## 2021-11-12 ENCOUNTER — Other Ambulatory Visit: Payer: Self-pay | Admitting: Obstetrics & Gynecology

## 2021-11-12 DIAGNOSIS — R928 Other abnormal and inconclusive findings on diagnostic imaging of breast: Secondary | ICD-10-CM

## 2021-11-20 ENCOUNTER — Ambulatory Visit
Admission: RE | Admit: 2021-11-20 | Discharge: 2021-11-20 | Disposition: A | Discharge status: Home / Self Care | Payer: 59 | Source: Ambulatory Visit | Attending: Obstetrics & Gynecology | Admitting: Obstetrics & Gynecology

## 2021-11-20 ENCOUNTER — Other Ambulatory Visit: Payer: Self-pay | Admitting: Obstetrics & Gynecology

## 2021-11-20 DIAGNOSIS — R921 Mammographic calcification found on diagnostic imaging of breast: Secondary | ICD-10-CM

## 2021-11-20 DIAGNOSIS — R928 Other abnormal and inconclusive findings on diagnostic imaging of breast: Secondary | ICD-10-CM

## 2021-11-27 ENCOUNTER — Ambulatory Visit
Admission: RE | Admit: 2021-11-27 | Discharge: 2021-11-27 | Disposition: A | Discharge status: Home / Self Care | Payer: 59 | Source: Ambulatory Visit | Attending: Obstetrics & Gynecology | Admitting: Obstetrics & Gynecology

## 2021-11-27 DIAGNOSIS — R921 Mammographic calcification found on diagnostic imaging of breast: Secondary | ICD-10-CM

## 2022-02-22 ENCOUNTER — Ambulatory Visit: Payer: 59 | Admitting: Nurse Practitioner

## 2022-04-02 ENCOUNTER — Other Ambulatory Visit: Payer: Self-pay | Admitting: *Deleted

## 2022-04-02 DIAGNOSIS — F411 Generalized anxiety disorder: Secondary | ICD-10-CM

## 2022-04-02 MED ORDER — SERTRALINE HCL 50 MG PO TABS: 50.0000 mg | ORAL_TABLET | Freq: Every day | ORAL | 1 refills | Status: DC

## 2022-04-02 NOTE — Telephone Encounter (Signed)
Patient requested refill.  Pended Rx and sent to [facility] for approval due to [place].

## 2022-09-25 ENCOUNTER — Other Ambulatory Visit: Payer: Self-pay | Admitting: Nurse Practitioner

## 2022-09-25 DIAGNOSIS — F411 Generalized anxiety disorder: Secondary | ICD-10-CM

## 2022-09-25 NOTE — Telephone Encounter (Signed)
Patient medication has High Risk Warnings.  

## 2022-10-06 ENCOUNTER — Other Ambulatory Visit: Payer: Self-pay | Admitting: Nurse Practitioner

## 2022-10-06 DIAGNOSIS — Z0000 Encounter for general adult medical examination without abnormal findings: Secondary | ICD-10-CM

## 2022-10-07 ENCOUNTER — Other Ambulatory Visit: Payer: 59

## 2022-10-07 LAB — CBC WITH DIFFERENTIAL/PLATELET
Eosinophils Absolute: 200 cells/uL (ref 15–500)
HCT: 40.2 % (ref 35.0–45.0)
Lymphs Abs: 1312 cells/uL (ref 850–3900)
MCHC: 32.3 g/dL (ref 32.0–36.0)
MPV: 11 fL (ref 7.5–12.5)
Monocytes Relative: 6.3 %
Neutrophils Relative %: 65 %
RDW: 13.1 % (ref 11.0–15.0)

## 2022-10-08 LAB — CBC WITH DIFFERENTIAL/PLATELET
Absolute Monocytes: 340 cells/uL (ref 200–950)
Basophils Absolute: 38 cells/uL (ref 0–200)
Basophils Relative: 0.7 %
Eosinophils Relative: 3.7 %
Hemoglobin: 13 g/dL (ref 11.7–15.5)
MCH: 29.5 pg (ref 27.0–33.0)
MCV: 91.4 fL (ref 80.0–100.0)
Neutro Abs: 3510 cells/uL (ref 1500–7800)
Platelets: 279 10*3/uL (ref 140–400)
RBC: 4.4 10*6/uL (ref 3.80–5.10)
Total Lymphocyte: 24.3 %
WBC: 5.4 10*3/uL (ref 3.8–10.8)

## 2022-10-08 LAB — COMPLETE METABOLIC PANEL WITH GFR
AG Ratio: 1.1 (calc) (ref 1.0–2.5)
ALT: 18 U/L (ref 6–29)
AST: 19 U/L (ref 10–35)
Albumin: 4.1 g/dL (ref 3.6–5.1)
Alkaline phosphatase (APISO): 48 U/L (ref 31–125)
BUN/Creatinine Ratio: 32 (calc) — ABNORMAL HIGH (ref 6–22)
BUN: 26 mg/dL — ABNORMAL HIGH (ref 7–25)
CO2: 26 mmol/L (ref 20–32)
Calcium: 9.8 mg/dL (ref 8.6–10.2)
Chloride: 105 mmol/L (ref 98–110)
Creat: 0.81 mg/dL (ref 0.50–0.99)
Globulin: 3.7 g/dL (calc) (ref 1.9–3.7)
Glucose, Bld: 96 mg/dL (ref 65–99)
Potassium: 4.6 mmol/L (ref 3.5–5.3)
Sodium: 139 mmol/L (ref 135–146)
Total Bilirubin: 0.3 mg/dL (ref 0.2–1.2)
Total Protein: 7.8 g/dL (ref 6.1–8.1)
eGFR: 90 mL/min/{1.73_m2} (ref >=60)

## 2022-10-08 LAB — LIPID PANEL
Cholesterol: 252 mg/dL — ABNORMAL HIGH (ref <200)
HDL: 61 mg/dL (ref >=50)
LDL Cholesterol (Calc): 155 mg/dL (calc) — ABNORMAL HIGH
Non-HDL Cholesterol (Calc): 191 mg/dL (calc) — ABNORMAL HIGH (ref <130)
Total CHOL/HDL Ratio: 4.1 (calc) (ref <5.0)
Triglycerides: 199 mg/dL — ABNORMAL HIGH (ref <150)

## 2022-10-11 ENCOUNTER — Ambulatory Visit: Payer: 59 | Admitting: Nurse Practitioner

## 2022-10-11 ENCOUNTER — Encounter: Payer: Self-pay | Admitting: Nurse Practitioner

## 2022-10-11 VITALS — BP 128/82 | HR 85 | Temp 97.1°F | Ht 65.0 in | Wt 158.2 lb

## 2022-10-11 DIAGNOSIS — E785 Hyperlipidemia, unspecified: Secondary | ICD-10-CM

## 2022-10-11 DIAGNOSIS — Z0000 Encounter for general adult medical examination without abnormal findings: Secondary | ICD-10-CM | POA: Diagnosis not present

## 2022-10-11 MED ORDER — ROSUVASTATIN CALCIUM 5 MG PO TABS: 2.5000 mg | ORAL_TABLET | ORAL | 0 refills | Status: DC

## 2022-10-11 NOTE — Patient Instructions (Signed)
Try to exercise 30 mins daily

## 2022-10-11 NOTE — Progress Notes (Unsigned)
Provider: Sharon Seller, NP  Patient Care Team: Sharon Seller, NP as PCP - General (Nurse Practitioner)  Extended Emergency Contact Information Primary Emergency Contact: Reyes,Josue Address: [address]          [place], [place] [address] Darden Amber of [place] Home Phone: [phone] Mobile Phone: [phone] Relation: Spouse Allergies  Allergen Reactions   Iodine     Rash on face, coughing,   Code Status: FULL Goals of Care: Advanced Directive information    10/11/2022   10:20 AM  Advanced Directives  Does Patient Have a Medical Advance Directive? No  Does patient want to make changes to medical advance directive? No - Patient declined     Chief Complaint  Patient presents with   Annual Exam    Yearly physical. Patient has a GYN, pending appointment for October. Discuss need for hep c screening, td/tdap, and colonoscopy. Patient denies receiving any vaccines since last visit.  Discuss recommendations for counselor services.     HPI: Patient is a 47 y.o. female seen in today for an wellness exam at [facility] She has made appt for evaluation for colonoscopy Seeing GYN oct 10th and having mammogram she had to have biopsy which was normal. Which was scary given her hx with her mothers breast cancer.   She is not consistent with her diet. Has been gaining weight.  3-4 times a week she eats whatever but other days she will try to eat protein with salad  LDL high- mom had tiny strokes that they found on a scan.   RA- remicaide working very well. Pain well controlled.        10/11/2022   10:19 AM 10/05/2021    9:55 AM 02/16/2021    3:21 PM 07/16/2019   11:47 AM 02/19/2019    1:33 PM  Depression screen PHQ 2/9  Decreased Interest 0 0 0 0 0  Down, Depressed, Hopeless 0 0 0 0 0  PHQ - 2 Score 0 0 0 0 0       02/19/2019    1:32 PM 07/16/2019   11:46 AM 02/16/2021    3:21 PM 10/05/2021    9:54 AM 10/11/2022   10:19 AM  Fall Risk  Falls in the past year? 1  0 0 0 1  Was there an injury with Fall? 0 0 0 0 0  Fall Risk Category Calculator 1 0 0 0 2  Fall Risk Category (Retired) Low Low Low Low   (RETIRED) Patient Fall Risk Level Low fall risk Low fall risk Low fall risk Low fall risk   Patient at Risk for Falls Due to   No Fall Risks No Fall Risks History of fall(s)  Fall risk Follow up   Falls evaluation completed Falls evaluation completed Falls evaluation completed       No data to display           Health Maintenance  Topic Date Due   Hepatitis C Screening  Never done   DTaP/Tdap/Td (1 - Tdap) Never done   Colonoscopy  12/22/2020   HIV Screening  10/16/2023 (Originally 08/06/1990)   INFLUENZA VACCINE  10/17/2022   MAMMOGRAM  11/21/2022   PAP SMEAR-Modifier  09/05/2023   COVID-19 Vaccine  Completed   HPV VACCINES  Aged Out    Past Medical History:  Diagnosis Date   ANA positive    Arthritis 2012   reactive arthritis   Foot pain    Hand pain    High risk  medication use    History of kidney disease as a child    Osteoarthritis    c spine   Raynaud's disease    Sacroiliitis ([facility])    Sicca complex ([facility])    Sjogren's syndrome ([facility])    Spondyloarthritis    Ulcerative colitis ([facility])     Past Surgical History:  Procedure Laterality Date   COLONOSCOPY     TUBAL LIGATION      Social History   Socioeconomic History   Marital status: Married    Spouse name: Not on file   Number of children: Not on file   Years of education: Not on file   Highest education level: Not on file  Occupational History   Not on file  Tobacco Use   Smoking status: Never   Smokeless tobacco: Never  Vaping Use   Vaping status: Never Used  Substance and Sexual Activity   Alcohol use: Yes    Alcohol/week: 0.0 standard drinks of alcohol    Comment: occ glass of wine   Drug use: No   Sexual activity: Yes    Birth control/protection: Surgical  Other Topics Concern   Not on file  Social History Narrative   Not on file   Social  Determinants of Health   Financial Resource Strain: Not on file  Food Insecurity: Not on file  Transportation Needs: Not on file  Physical Activity: Not on file  Stress: Not on file  Social Connections: Not on file    Family History  Problem Relation Age of Onset   Cancer Mother 86       Breast   Hypertension Mother    Cancer Father 36       bone   Ulcerative colitis Brother 51   Diabetes Brother    Gallbladder disease Daughter    Diabetes Maternal Grandmother    Stomach cancer Maternal Grandmother    Colon cancer Neg Hx    Colon polyps Neg Hx    Esophageal cancer Neg Hx    Rectal cancer Neg Hx     Review of Systems:  Review of Systems  Constitutional:  Negative for chills, fever and weight loss.  HENT:  Negative for tinnitus.   Respiratory:  Negative for cough, sputum production and shortness of breath.   Cardiovascular:  Negative for chest pain, palpitations and leg swelling.  Gastrointestinal:  Negative for abdominal pain, constipation, diarrhea and heartburn.  Genitourinary:  Negative for dysuria, frequency and urgency.  Musculoskeletal:  Negative for back pain, falls, joint pain and myalgias.  Skin: Negative.   Neurological:  Negative for dizziness and headaches.  Psychiatric/Behavioral:  Negative for depression and memory loss. The patient does not have insomnia.      Allergies as of 10/11/2022       Reactions   Iodine    Rash on face, coughing,        Medication List        Accurate as of October 11, 2022 10:29 AM. If you have any questions, ask your nurse or doctor.          STOP taking these medications    NON FORMULARY Stopped by: Sharon Seller       TAKE these medications    acetaminophen 500 MG tablet Commonly known as: TYLENOL Take 1,000 mg by mouth every 12 (twelve) hours as needed.   Avsola 100 MG injection Generic drug: inFLIXimab-axxq Inject into the vein every 8 (eight) weeks.   Biotin 10 MG Tabs  Take 10 mg by mouth  daily.   cetirizine 10 MG tablet Commonly known as: ZYRTEC Take 10 mg by mouth as needed.   diclofenac Sodium 1 % Gel Commonly known as: VOLTAREN Apply topically as needed.   folic acid 1 MG tablet Commonly known as: FOLVITE Take 1 mg by mouth daily.   ibuprofen 200 MG tablet Commonly known as: ADVIL Take 200 mg by mouth as needed.   leucovorin 5 MG tablet Commonly known as: WELLCOVORIN Take 5 mg by mouth once a week.   Lo Loestrin Fe 1 MG-10 MCG / 10 MCG tablet Generic drug: Norethindrone-Ethinyl Estradiol-Fe Biphas Take 1 tablet by mouth daily.   MELATONIN PO Take 2 mg by mouth daily.   methotrexate 10 MG tablet Commonly known as: RHEUMATREX Take 10 mg by mouth once a week. Caution: Chemotherapy. Protect from light.   predniSONE 10 MG tablet Commonly known as: DELTASONE Take 10 mg by mouth as needed.   sertraline 50 MG tablet Commonly known as: ZOLOFT TAKE 1 TABLET BY MOUTH EVERY DAY   VITAMIN B-12 PO Take 1 tablet by mouth daily.   VITAMIN D PO Take 1 tablet by mouth daily.          Physical Exam: Vitals:   10/11/22 1015  BP: 128/82  Pulse: 85  Temp: (!) 97.1 F (36.2 C)  TempSrc: Temporal  SpO2: 99%  Weight: 158 lb 3.2 oz (71.8 kg)  Height: 5\' 5"  (1.651 m)   Body mass index is 26.33 kg/m. Wt Readings from Last 3 Encounters:  10/11/22 158 lb 3.2 oz (71.8 kg)  10/05/21 147 lb (66.7 kg)  02/16/21 140 lb (63.5 kg)    Physical Exam Constitutional:      General: She is not in acute distress.    Appearance: She is well-developed. She is not diaphoretic.  HENT:     Head: Normocephalic and atraumatic.     Mouth/Throat:     Pharynx: No oropharyngeal exudate.  Eyes:     Conjunctiva/sclera: Conjunctivae normal.     Pupils: Pupils are equal, round, and reactive to light.  Cardiovascular:     Rate and Rhythm: Normal rate and regular rhythm.     Heart sounds: Normal heart sounds.  Pulmonary:     Effort: Pulmonary effort is normal.      Breath sounds: Normal breath sounds.  Abdominal:     General: Bowel sounds are normal.     Palpations: Abdomen is soft.  Musculoskeletal:     Cervical back: Normal range of motion and neck supple.     Right lower leg: No edema.     Left lower leg: No edema.  Skin:    General: Skin is warm and dry.  Neurological:     Mental Status: She is alert and oriented to person, place, and time.  Psychiatric:        Mood and Affect: Mood normal.     Labs reviewed: Basic Metabolic Panel: Recent Labs    10/07/22 1023  NA 139  K 4.6  CL 105  CO2 26  GLUCOSE 96  BUN 26*  CREATININE 0.81  CALCIUM 9.8   Liver Function Tests: Recent Labs    10/07/22 1023  AST 19  ALT 18  BILITOT 0.3  PROT 7.8   No results for input(s): "LIPASE", "AMYLASE" in the last 8760 hours. No results for input(s): "AMMONIA" in the last 8760 hours. CBC: Recent Labs    10/07/22 1023  WBC 5.4  NEUTROABS 3,510  HGB 13.0  HCT 40.2  MCV 91.4  PLT 279   Lipid Panel: Recent Labs    10/07/22 1023  CHOL 252*  HDL 61  LDLCALC 155*  TRIG 199*  CHOLHDL 4.1   No results found for: "HGBA1C"  Procedures: No results found.  Assessment/Plan 1. Preventative health care -wellness visit completed today, The patient was counseled regarding the appropriate use of alcohol, regular self-examination of the breasts on a monthly basis, prevention of dental and periodontal disease, diet, regular sustained exercise for at least 30 minutes 5 times per week, routine screening interval for mammogram as recommended by the [employer] and ACOG, importance of regular PAP smears, and recommended schedule for GI hemoccult testing, colonoscopy, cholesterol, thyroid and diabetes screening.  2. Hyperlipidemia, unspecified hyperlipidemia type -she has tired dietary modifications but cholesterol continues to trend up.  She has hx of RA with pain, will start low dose crestor three times weekly to reduce risk of myalagias  but educated her to notify for any increase in pain.  - rosuvastatin (CRESTOR) 5 MG tablet; Take 0.5 tablets (2.5 mg total) by mouth 3 (three) times a week.  Dispense: 30 tablet; Refill: 0 - Lipid panel; Future - Complete Metabolic Panel with eGFR; Future  Follow up- 12/11/2022, labs prior  [place] K. Biagio Borg [facility] [phone]    [facility] [phone]

## 2022-12-08 NOTE — Progress Notes (Unsigned)
12/09/2022 Staisha Velazquez-Rodriguez [id number] 10-Jun-1975   CHIEF COMPLAINT: Schedule a colonoscopy   HISTORY OF PRESENT ILLNESS: Lucinda Wind is a 47 year old female with a past medical history of ankylosing spondylitis, Raynaud's disease, Sjogren's syndrome and ulcerative colitis. She presents to our office today to schedule a colonoscopy. She previously established care with Dr. Christella Hartigan 04/2014 after she moved from [place] to [place].  She was initially diagnosed with ulcerative colitis when pregnant in 2003.  At that time, she was prescribed Asacol and Rowasa enemas which resulted in remission and she was off all medications for approximately 8 years. She started having UC flares with bloody diarrhea in 2012 and was prescribed Prednisone and Asacol was restarted.  She developed reactive arthritis and was subsequently diagnosed with ankylosing spondylitis and was started on Humira 2012 by her rheumatologist and her arthritis and UC symptoms significantly improved.  She is also on Methotrexate.  She remained on Humira until 2015 which became ineffective therefore she was started on Remicade then switched to Avsola every 8 weeks as prescribed by her rheumatologist.  She underwent a colonoscopy by Dr. Christella Hartigan 01/01/2018 which was negative for active ulcerative colitis and identified one 2 mm polyp removed from the ascending colon, path report was consistent with benign lymphoid aggregate and not a true colon polyp.  She was advised to repeat a colonoscopy in 3 years which has not been done.  No known family history of colorectal cancer.  Her brother had ulcerative colitis and she believes her father most likely had IBD was never officially diagnosed.  Her ulcerative colitis symptoms are well-controlled on Avsola, however, if she does not sleep well at night she develops lower abdominal pain.  She typically passes 1-2 normal formed brown bowel movements daily.  No rectal bleeding  or mucus per the rectum.  She infrequently takes Ibuprofen for arthritis pain.      Latest Ref Rng & Units 10/07/2022   10:23 AM 10/02/2021    9:16 AM 09/11/2020   12:00 AM  CBC  WBC 3.8 - 10.8 Thousand/uL 5.4  4.6  5.7      Hemoglobin 11.7 - 15.5 g/dL 40.9  81.1  91.4      Hematocrit 35.0 - 45.0 % 40.2  39.2  39      Platelets 140 - 400 Thousand/uL 279  255  277         This result is from an external source.        Latest Ref Rng & Units 10/07/2022   10:23 AM 10/02/2021    9:16 AM 09/11/2020   12:00 AM  CMP  Glucose 65 - 99 mg/dL 96  92    BUN 7 - 25 mg/dL 26  19  16       Creatinine 0.50 - 0.99 mg/dL 7.82  9.56  0.8      Sodium 135 - 146 mmol/L 139  137  139      Potassium 3.5 - 5.3 mmol/L 4.6  4.4  4.3      Chloride 98 - 110 mmol/L 105  106  102      CO2 20 - 32 mmol/L 26  24  23       Calcium 8.6 - 10.2 mg/dL 9.8  9.5  9.1      Total Protein 6.1 - 8.1 g/dL 7.8  7.9    Total Bilirubin 0.2 - 1.2 mg/dL 0.3  0.4    AST 10 - 35 U/L 19  12  14      ALT 6 - 29 U/L 18  9  18          This result is from an external source.    PAST GI PROCEDURES:  Colonoscopy 01/01/2018 by Dr. Christella Hartigan: - The examined portion of the ileum was normal.  - One 2 mm polyp in the ascending colon, removed with a cold snare. Resected and retrieved.  - Extensive biospies take from the right and left colon to check for dysplasia given longstanding disease which appears to be under very good clinical and endoscopic control currently - 3 year recall colonoscopy  1. Surgical [P], right colon bxs - COLONIC MUCOSA WITH NO SIGNIFICANT PATHOLOGIC CHANGES. - NEGATIVE FOR DYSPLASIA. - NO ACTIVE INFLAMMATION OR GRANULOMAS. 2. Surgical [P], left colon bxs - COLONIC MUCOSA WITH NO SIGNIFICANT PATHOLOGIC CHANGES. - NEGATIVE FOR DYSPLASIA. - NO ACTIVE INFLAMMATION OR GRANULOMAS. 3. Surgical [P], ascending, polyp - COLONIC MUCOSA WITH BENIGN LYMPHOID AGGREGATE. - NO ADENOMATOUS CHANGE OR MALIGNANCY.  Colonoscopy  January 2014, [place] described to have a normal cecum and ileocecal valve, there was minimal erythema throughout the colon, random biopsies were taken. Biopsies showed active ulcerative colitis without dysplasia.   Colonoscopy September 2012 in [place] described as having a normal terminal ileum. TI was biopsied to rule out inflammation. There was mild erythema and granularity throughout the entire colon. Random biopsies were taken. Biopsies showed mild chronic ileitis and active colitis.   Colonoscopy in 2003 and 2005. These were both in [place] Dr. Christella Hartigan previously reviewed hand written reports which were completely illegible. A biopsy from the 2003 colonoscopy does show chronic, active colitis.    Past Medical History:  Diagnosis Date   ANA positive    Anxiety    Arthritis 2012   reactive arthritis   Foot pain    Hand pain    High risk medication use    History of kidney disease as a child    Hyperlipidemia    Osteoarthritis    c spine   Raynaud's disease    Sacroiliitis (HCC)    Sicca complex (HCC)    Sjogren's syndrome (HCC)    Spondyloarthritis    Ulcerative colitis (HCC)    Past Surgical History:  Procedure Laterality Date   COLONOSCOPY     CYSTOSCOPY  1985   with urethral dialtion   TUBAL LIGATION  2003   Social History: She is married.  She has 1 son and 1 daughter.  Remote social smoker during college years.  Infrequent alcohol use.  No drug use.  Family History: Mother with history of hypertension and breast cancer with metastasis. Father had bone cancer. Brother with diabetes. Brother with ulcerative colitis. Maternal grandmother with stomach cancer. Maternal aunt with lupus.   Allergies  Allergen Reactions   Iodine     Rash on face, coughing,      Outpatient Encounter Medications as of 12/09/2022  Medication Sig   acetaminophen (TYLENOL) 500 MG tablet Take 1,000 mg by mouth every 12 (twelve) hours as needed.   Biotin 10 MG TABS Take 10 mg  by mouth daily.   cetirizine (ZYRTEC) 10 MG tablet Take 10 mg by mouth as needed.    Cholecalciferol (VITAMIN D PO) Take 1 tablet by mouth daily.   Cyanocobalamin (VITAMIN B-12 PO) Take 1 tablet by mouth daily.   diclofenac Sodium (VOLTAREN) 1 % GEL Apply topically as needed.   folic acid (FOLVITE) 1 MG tablet Take 1  mg by mouth daily.   ibuprofen (ADVIL,MOTRIN) 200 MG tablet Take 200 mg by mouth as needed.   inFLIXimab-axxq (AVSOLA) 100 MG injection Inject into the vein every 8 (eight) weeks.   leucovorin (WELLCOVORIN) 5 MG tablet Take 5 mg by mouth once a week.   MELATONIN PO Take 2 mg by mouth daily.   methotrexate (RHEUMATREX) 10 MG tablet Take 10 mg by mouth once a week. Caution: Chemotherapy. Protect from light.   Na Sulfate-K Sulfate-Mg Sulf (SUPREP BOWEL PREP KIT) 17.5-3.13-1.6 GM/177ML SOLN Take 1 kit by mouth as directed. For colonoscopy prep   Norethindrone-Ethinyl Estradiol-Fe Biphas (LO LOESTRIN FE) 1 MG-10 MCG / 10 MCG tablet Take 1 tablet by mouth daily.   predniSONE (DELTASONE) 10 MG tablet Take 10 mg by mouth as needed.   rosuvastatin (CRESTOR) 5 MG tablet Take 0.5 tablets (2.5 mg total) by mouth 3 (three) times a week.   sertraline (ZOLOFT) 50 MG tablet TAKE 1 TABLET BY MOUTH EVERY DAY   [DISCONTINUED] hyoscyamine (LEVSIN SL) 0.125 MG SL tablet Place 1 tablet (0.125 mg total) under the tongue every 8 (eight) hours as needed.   No facility-administered encounter medications on file as of 12/09/2022.    REVIEW OF SYSTEMS:  Gen: + Fatigue. + Night sweats with perimenopause. No weight loss.  CV: Denies chest pain, palpitations or edema. Resp: Denies cough, shortness of breath of hemoptysis.  GI: Denies heartburn, dysphagia, stomach or lower abdominal pain. No diarrhea or constipation.  GU: Denies urinary burning, blood in urine, increased urinary frequency or incontinence. MS: + Arthritis and back pain. Derm: Denies rash, itchiness, skin lesions or unhealing ulcers. Psych: +  Anxiety.  Heme: Denies bruising, easy bleeding. Neuro:  Denies headaches, dizziness or paresthesias. Endo:  Denies any problems with DM, thyroid or adrenal function.  PHYSICAL EXAM: BP (!) 180/90 (BP Location: Left Arm, Patient Position: Sitting, Cuff Size: Normal)   Pulse 74   Ht 5' 4.17" (1.63 m) Comment: height measured without shoes  Wt 159 lb 4 oz (72.2 kg)   BMI 27.19 kg/m  General: 47 year old female in no acute distress. Head: Normocephalic and atraumatic. Eyes:  Sclerae non-icteric, conjunctive pink. Ears: Normal auditory acuity. Mouth: Dentition intact. No ulcers or lesions.  Neck: Supple, no lymphadenopathy or thyromegaly.  Lungs: Clear bilaterally to auscultation without wheezes, crackles or rhonchi. Heart: Regular rate and rhythm. No murmur, rub or gallop appreciated.  Abdomen: Soft, nontender, nondistended. No masses. No hepatosplenomegaly. Normoactive bowel sounds x 4 quadrants.  Rectal: Deferred.  Musculoskeletal: Symmetrical with no gross deformities. Skin: Warm and dry. No rash or lesions on visible extremities. Extremities: No edema. Neurological: Alert oriented x 4, no focal deficits.  Psychological:  Alert and cooperative. Normal mood and affect.  ASSESSMENT AND PLAN:  47 year old female with a history of ulcerative colitis initially diagnosed in 2003.  Avsola as prescribed by her rheumatologist for ankylosing spondylitis with dual benefit regarding ulcerative colitis.  Her most recent colonoscopy 12/2017 did not show any evidence of active ulcerative colitis or dysplasia. -Colonoscopy benefits and risks discussed including risk with sedation, risk of bleeding, perforation and infection  -Avoid Ibuprofen/NSAIDs -Further recommendations to be determined after colonoscopy completed  History of ankylosing spondylitis on Avsola and Methotrexate per rheumatology  History of Raynaud's disease  History of Sjogren's syndrome    CC:  Sharon Seller,  NP

## 2022-12-09 ENCOUNTER — Ambulatory Visit: Payer: 59 | Admitting: Nurse Practitioner

## 2022-12-09 ENCOUNTER — Ambulatory Visit: Payer: 59 | Admitting: Gastroenterology

## 2022-12-09 ENCOUNTER — Encounter: Payer: Self-pay | Admitting: Nurse Practitioner

## 2022-12-09 VITALS — BP 180/90 | HR 74 | Ht 64.17 in | Wt 159.2 lb

## 2022-12-09 DIAGNOSIS — R1032 Left lower quadrant pain: Secondary | ICD-10-CM

## 2022-12-09 DIAGNOSIS — K5190 Ulcerative colitis, unspecified, without complications: Secondary | ICD-10-CM | POA: Diagnosis not present

## 2022-12-09 MED ORDER — HYOSCYAMINE SULFATE 0.125 MG SL SUBL: 0.1250 mg | SUBLINGUAL_TABLET | Freq: Three times a day (TID) | SUBLINGUAL | 0 refills | Status: DC | PRN

## 2022-12-09 MED ORDER — NA SULFATE-K SULFATE-MG SULF 17.5-3.13-1.6 GM/177ML PO SOLN: 1.0000 | ORAL | 0 refills | Status: DC

## 2022-12-09 NOTE — Patient Instructions (Addendum)
You have been scheduled for a colonoscopy. Please follow written instructions given to you at your visit today.   Please pick up your prep supplies at the pharmacy within the next 1-3 days.  If you use inhalers (even only as needed), please bring them with you on the day of your procedure.  DO NOT TAKE 7 DAYS PRIOR TO TEST- Trulicity (dulaglutide) Ozempic, Wegovy (semaglutide) Mounjaro (tirzepatide) Bydureon Bcise (exanatide extended release)  DO NOT TAKE 1 DAY PRIOR TO YOUR TEST Rybelsus (semaglutide) Adlyxin (lixisenatide) Victoza (liraglutide) Byetta (exanatide) ___________________________________________________________________________   We have sent the following medications to your pharmacy for you to pick up at your convenience: Suprep   Due to recent changes in healthcare laws, you may see the results of your imaging and laboratory studies on MyChart before your provider has had a chance to review them.  We understand that in some cases there may be results that are confusing or concerning to you. Not all laboratory results come back in the same time frame and the provider may be waiting for multiple results in order to interpret others.  Please give [place] 48 hours in order for your provider to thoroughly review all the results before contacting the office for clarification of your results.   Thank you for choosing me and [facility].  Noel Gerold- Katrinka Blazing,  CRNP

## 2022-12-10 NOTE — Progress Notes (Signed)
Agree with the assessment and plan as outlined by Colleen Kennedy-Smith, NP.   Trask Vosler, DO, FACG [facility]   

## 2022-12-11 ENCOUNTER — Other Ambulatory Visit: Payer: 59

## 2022-12-11 DIAGNOSIS — E785 Hyperlipidemia, unspecified: Secondary | ICD-10-CM

## 2022-12-12 LAB — COMPLETE METABOLIC PANEL WITH GFR
AG Ratio: 1.1 (calc) (ref 1.0–2.5)
ALT: 15 U/L (ref 6–29)
AST: 14 U/L (ref 10–35)
Albumin: 4 g/dL (ref 3.6–5.1)
Alkaline phosphatase (APISO): 42 U/L (ref 31–125)
BUN: 17 mg/dL (ref 7–25)
CO2: 24 mmol/L (ref 20–32)
Calcium: 9.5 mg/dL (ref 8.6–10.2)
Chloride: 107 mmol/L (ref 98–110)
Creat: 0.86 mg/dL (ref 0.50–0.99)
Globulin: 3.5 g/dL (calc) (ref 1.9–3.7)
Glucose, Bld: 88 mg/dL (ref 65–99)
Potassium: 4.3 mmol/L (ref 3.5–5.3)
Sodium: 139 mmol/L (ref 135–146)
Total Bilirubin: 0.4 mg/dL (ref 0.2–1.2)
Total Protein: 7.5 g/dL (ref 6.1–8.1)
eGFR: 84 mL/min/{1.73_m2} (ref >=60)

## 2022-12-12 LAB — LIPID PANEL
Cholesterol: 213 mg/dL — ABNORMAL HIGH (ref <200)
HDL: 57 mg/dL (ref >=50)
LDL Cholesterol (Calc): 127 mg/dL (calc) — ABNORMAL HIGH
Non-HDL Cholesterol (Calc): 156 mg/dL (calc) — ABNORMAL HIGH (ref <130)
Total CHOL/HDL Ratio: 3.7 (calc) (ref <5.0)
Triglycerides: 169 mg/dL — ABNORMAL HIGH (ref <150)

## 2022-12-12 NOTE — Progress Notes (Signed)
This service is provided via telemedicine  No vital signs collected/recorded due to the encounter was a telemedicine visit.   Location of patient (ex: home, work):  Home  Patient consents to a telephone visit: Yes  Location of the provider (ex: office, home):  [facility], Office   Name of any referring provider:  N/A  Names of all persons participating in the telemedicine service and their role in the encounter:  S.Chrae B/CMA, Abbey Chatters, NP, and Patient   Time spent on call:  6 min with medical assistant

## 2022-12-13 ENCOUNTER — Telehealth (INDEPENDENT_AMBULATORY_CARE_PROVIDER_SITE_OTHER): Payer: 59 | Admitting: Nurse Practitioner

## 2022-12-13 ENCOUNTER — Ambulatory Visit (HOSPITAL_COMMUNITY): Payer: 59

## 2022-12-13 ENCOUNTER — Encounter: Payer: Self-pay | Admitting: Nurse Practitioner

## 2022-12-13 DIAGNOSIS — E785 Hyperlipidemia, unspecified: Secondary | ICD-10-CM | POA: Diagnosis not present

## 2022-12-13 NOTE — Progress Notes (Signed)
Careteam: Patient Care Team: Sharon Seller, NP as PCP - General (Nurse Practitioner)  Advanced Directive information Does Patient Have a Medical Advance Directive?: No, Would patient like information on creating a medical advance directive?: No - Patient declined  Allergies  Allergen Reactions   Iodine     Rash on face, coughing,    Chief Complaint  Patient presents with   Medical Management of Chronic Issues    2 month follow-up. Discuss need for hep c screening, td/tdap, cervical cancer screening, colonoscopy, mammogram, and flu vaccine. Visit performed via telephone/video. Discuss labs.      HPI: Patient is a 47 y.o. female to follow up cholesterol   LDL has improved with starting crestor. Labs reviewed with pt.  Reports she had myalgias when started medication but this has improved now.  She is currently taking half tablet 3 times weekly.  Has also made dietary modification to help with cholesterol control.    Review of Systems:  Review of Systems  Constitutional:  Negative for chills and fever.  Musculoskeletal:  Negative for myalgias.    Past Medical History:  Diagnosis Date   ANA positive    Anxiety    Arthritis 2012   reactive arthritis   Foot pain    Hand pain    High risk medication use    History of kidney disease as a child    Hyperlipidemia    Osteoarthritis    c spine   Raynaud's disease    Sacroiliitis (HCC)    Sicca complex (HCC)    Sjogren's syndrome (HCC)    Spondyloarthritis    Ulcerative colitis (HCC)    Past Surgical History:  Procedure Laterality Date   COLONOSCOPY     CYSTOSCOPY  1985   with urethral dialtion   TUBAL LIGATION  2003   Social History:   reports that she has never smoked. She has never used smokeless tobacco. She reports current alcohol use. She reports that she does not use drugs.  Family History  Problem Relation Age of Onset   Breast cancer Mother 48   Hypertension Mother    Bone cancer Father 67    Ulcerative colitis Brother 29   Diabetes Brother    Kidney disease Brother    Diabetes Maternal Grandmother    Stomach cancer Maternal Grandmother    Gallbladder disease Daughter    Pancreatic cancer Half-Brother    Colon cancer Neg Hx    Colon polyps Neg Hx    Esophageal cancer Neg Hx    Rectal cancer Neg Hx     Medications: Patient's Medications  New Prescriptions   No medications on file  Previous Medications   ACETAMINOPHEN (TYLENOL) 500 MG TABLET    Take 1,000 mg by mouth every 12 (twelve) hours as needed.   BIOTIN 10 MG TABS    Take 10 mg by mouth daily.   CETIRIZINE (ZYRTEC) 10 MG TABLET    Take 10 mg by mouth as needed.    CHOLECALCIFEROL (VITAMIN D PO)    Take 1 tablet by mouth daily.   CYANOCOBALAMIN (VITAMIN B-12 PO)    Take 1 tablet by mouth daily.   DICLOFENAC SODIUM (VOLTAREN) 1 % GEL    Apply topically as needed.   FOLIC ACID (FOLVITE) 1 MG TABLET    Take 1 mg by mouth daily.   IBUPROFEN (ADVIL,MOTRIN) 200 MG TABLET    Take 200 mg by mouth as needed.   INFLIXIMAB-AXXQ (AVSOLA) 100 MG INJECTION  Inject into the vein every 8 (eight) weeks.   LEUCOVORIN (WELLCOVORIN) 5 MG TABLET    Take 5 mg by mouth once a week.   MELATONIN PO    Take 2 mg by mouth daily.   METHOTREXATE (RHEUMATREX) 10 MG TABLET    Take 10 mg by mouth once a week. Caution: Chemotherapy. Protect from light.   NA SULFATE-K SULFATE-MG SULF (SUPREP BOWEL PREP KIT) 17.5-3.13-1.6 GM/177ML SOLN    Take 1 kit by mouth as directed. For colonoscopy prep   NORETHINDRONE-ETHINYL ESTRADIOL-FE BIPHAS (LO LOESTRIN FE) 1 MG-10 MCG / 10 MCG TABLET    Take 1 tablet by mouth daily.   PREDNISONE (DELTASONE) 10 MG TABLET    Take 10 mg by mouth as needed.   ROSUVASTATIN (CRESTOR) 5 MG TABLET    Take 0.5 tablets (2.5 mg total) by mouth 3 (three) times a week.   SERTRALINE (ZOLOFT) 50 MG TABLET    TAKE 1 TABLET BY MOUTH EVERY DAY  Modified Medications   No medications on file  Discontinued Medications   No medications on  file    Physical Exam:  There were no vitals filed for this visit. There is no height or weight on file to calculate BMI. Wt Readings from Last 3 Encounters:  12/09/22 159 lb 4 oz (72.2 kg)  10/11/22 158 lb 3.2 oz (71.8 kg)  10/05/21 147 lb (66.7 kg)    Physical Exam Constitutional:      Appearance: Normal appearance.  Pulmonary:     Effort: Pulmonary effort is normal.  Neurological:     Mental Status: She is alert. Mental status is at baseline.  Psychiatric:        Mood and Affect: Mood normal.     Labs reviewed: Basic Metabolic Panel: Recent Labs    10/07/22 1023 12/11/22 0818  NA 139 139  K 4.6 4.3  CL 105 107  CO2 26 24  GLUCOSE 96 88  BUN 26* 17  CREATININE 0.81 0.86  CALCIUM 9.8 9.5   Liver Function Tests: Recent Labs    10/07/22 1023 12/11/22 0818  AST 19 14  ALT 18 15  BILITOT 0.3 0.4  PROT 7.8 7.5   No results for input(s): "LIPASE", "AMYLASE" in the last 8760 hours. No results for input(s): "AMMONIA" in the last 8760 hours. CBC: Recent Labs    10/07/22 1023  WBC 5.4  NEUTROABS 3,510  HGB 13.0  HCT 40.2  MCV 91.4  PLT 279   Lipid Panel: Recent Labs    10/07/22 1023 12/11/22 0818  CHOL 252* 213*  HDL 61 57  LDLCALC 155* 127*  TRIG 199* 169*  CHOLHDL 4.1 3.7   TSH: No results for input(s): "TSH" in the last 8760 hours. A1C: No results found for: "HGBA1C"   Assessment/Plan 1. Hyperlipidemia, unspecified hyperlipidemia type -will have her increase crestor by half tablet weekly every month until she gets to half tablet daily. Will start by adding additional half tablet this week- so she is taking crestor 5 mg half tabet 4 times a week.  -continue to work on dietary modifications - COMPLETE METABOLIC PANEL WITH GFR; Future - Lipid panel; Future    Next appt: 6 months with labs prior  Eldena Dede K. Biagio Borg  [facility] [phone]    Virtual Visit via video  I connected with patient on  12/13/22 at 10:20 AM EDT by mychart and verified that I am speaking with the correct person using two identifiers.  Location: Patient: home Provider: home office   I discussed the limitations, risks, security and privacy concerns of performing an evaluation and management service by telephone and the availability of in person appointments. I also discussed with the patient that there may be a patient responsible charge related to this service. The patient expressed understanding and agreed to proceed.   I discussed the assessment and treatment plan with the patient. The patient was provided an opportunity to ask questions and all were answered. The patient agreed with the plan and demonstrated an understanding of the instructions.   The patient was advised to call back or seek an in-person evaluation if the symptoms worsen or if the condition fails to improve as anticipated.  I provided 15 minutes of non-face-to-face time during this encounter.  Janene Harvey. Biagio Borg Avs printed and mailed

## 2022-12-26 ENCOUNTER — Other Ambulatory Visit: Payer: Self-pay | Admitting: Nurse Practitioner

## 2022-12-26 DIAGNOSIS — E785 Hyperlipidemia, unspecified: Secondary | ICD-10-CM

## 2023-02-18 ENCOUNTER — Encounter: Payer: Self-pay | Admitting: Gastroenterology

## 2023-02-24 ENCOUNTER — Other Ambulatory Visit: Payer: Self-pay

## 2023-02-24 DIAGNOSIS — E785 Hyperlipidemia, unspecified: Secondary | ICD-10-CM

## 2023-02-24 MED ORDER — ROSUVASTATIN CALCIUM 5 MG PO TABS: 2.5000 mg | ORAL_TABLET | Freq: Every day | ORAL | 3 refills | Status: DC

## 2023-02-28 ENCOUNTER — Encounter: Payer: Self-pay | Admitting: Gastroenterology

## 2023-02-28 ENCOUNTER — Ambulatory Visit (AMBULATORY_SURGERY_CENTER): Payer: 59 | Admitting: Gastroenterology

## 2023-02-28 VITALS — BP 172/88 | HR 58 | Temp 98.6°F | Resp 11 | Ht 60.17 in | Wt 159.4 lb

## 2023-02-28 DIAGNOSIS — D123 Benign neoplasm of transverse colon: Secondary | ICD-10-CM

## 2023-02-28 DIAGNOSIS — K5190 Ulcerative colitis, unspecified, without complications: Secondary | ICD-10-CM

## 2023-02-28 MED ORDER — SODIUM CHLORIDE 0.9 % IV SOLN: 500.0000 mL | INTRAVENOUS | Status: DC

## 2023-02-28 NOTE — Progress Notes (Signed)
GASTROENTEROLOGY PROCEDURE H&P NOTE   Primary Care Physician: Sharon Seller, NP    Reason for Procedure:  Ulcerative Colitis, surveillance  Plan:    Colonoscopy  Patient is appropriate for endoscopic procedure(s) in the ambulatory ([facility]) setting.  The nature of the procedure, as well as the risks, benefits, and alternatives were carefully and thoroughly reviewed with the patient. Ample time for discussion and questions allowed. The patient understood, was satisfied, and agreed to proceed.     HPI: Laura Finley is a 47 y.o. female who presents for colonoscopy for ongoing UC surveillance.  UC is currently under control with Avsola and is also prescribed MTX for concomitant ankylosing spondylitis.  Last colonoscopy was 12/2017 and without active inflammation or dysplasia.  Past Medical History:  Diagnosis Date   ANA positive    Anxiety    Arthritis 2012   reactive arthritis   Foot pain    Hand pain    High risk medication use    History of kidney disease as a child    Hyperlipidemia    Osteoarthritis    c spine   Raynaud's disease    Sacroiliitis (HCC)    Sicca complex (HCC)    Sjogren's syndrome (HCC)    Spondyloarthritis    Ulcerative colitis (HCC)     Past Surgical History:  Procedure Laterality Date   COLONOSCOPY     CYSTOSCOPY  1985   with urethral dialtion   TUBAL LIGATION  2003    Prior to Admission medications   Medication Sig Start Date End Date Taking? Authorizing Provider  acetaminophen (TYLENOL) 500 MG tablet Take 1,000 mg by mouth every 12 (twelve) hours as needed.   Yes [provider]  Biotin 10 MG TABS Take 10 mg by mouth daily.   Yes [provider]  cetirizine (ZYRTEC) 10 MG tablet Take 10 mg by mouth as needed.    Yes [provider]  Cholecalciferol (VITAMIN D PO) Take 1 tablet by mouth daily.   Yes [provider]  Cyanocobalamin (VITAMIN B-12 PO) Take 1 tablet by mouth daily.   Yes  [provider]  diclofenac Sodium (VOLTAREN) 1 % GEL Apply topically as needed.   Yes [provider]  folic acid (FOLVITE) 1 MG tablet Take 1 mg by mouth daily.   Yes [provider]  leucovorin (WELLCOVORIN) 5 MG tablet Take 5 mg by mouth once a week.   Yes [provider]  MELATONIN PO Take 2 mg by mouth daily.   Yes [provider]  Norethindrone-Ethinyl Estradiol-Fe Biphas (LO LOESTRIN FE) 1 MG-10 MCG / 10 MCG tablet Take 1 tablet by mouth daily.   Yes [provider]  rosuvastatin (CRESTOR) 5 MG tablet Take 0.5 tablets (2.5 mg total) by mouth daily. 02/24/23  Yes Sharon Seller, NP  sertraline (ZOLOFT) 50 MG tablet TAKE 1 TABLET BY MOUTH EVERY DAY 09/25/22  Yes Sharon Seller, NP  ibuprofen (ADVIL,MOTRIN) 200 MG tablet Take 200 mg by mouth as needed.    [provider]  inFLIXimab-axxq (AVSOLA) 100 MG injection Inject into the vein every 8 (eight) weeks.    [provider]  methotrexate (RHEUMATREX) 10 MG tablet Take 10 mg by mouth once a week. Caution: Chemotherapy. Protect from light.    [provider]  predniSONE (DELTASONE) 10 MG tablet Take 10 mg by mouth as needed.    [provider]    Current Outpatient Medications  Medication Sig Dispense Refill  acetaminophen (TYLENOL) 500 MG tablet Take 1,000 mg by mouth every 12 (twelve) hours as needed.     Biotin 10 MG TABS Take 10 mg by mouth daily.     cetirizine (ZYRTEC) 10 MG tablet Take 10 mg by mouth as needed.      Cholecalciferol (VITAMIN D PO) Take 1 tablet by mouth daily.     Cyanocobalamin (VITAMIN B-12 PO) Take 1 tablet by mouth daily.     diclofenac Sodium (VOLTAREN) 1 % GEL Apply topically as needed.     folic acid (FOLVITE) 1 MG tablet Take 1 mg by mouth daily.     leucovorin (WELLCOVORIN) 5 MG tablet Take 5 mg by mouth once a week.     MELATONIN PO Take 2 mg by mouth daily.     Norethindrone-Ethinyl Estradiol-Fe Biphas (LO  LOESTRIN FE) 1 MG-10 MCG / 10 MCG tablet Take 1 tablet by mouth daily.     rosuvastatin (CRESTOR) 5 MG tablet Take 0.5 tablets (2.5 mg total) by mouth daily. 45 tablet 3   sertraline (ZOLOFT) 50 MG tablet TAKE 1 TABLET BY MOUTH EVERY DAY 90 tablet 1   ibuprofen (ADVIL,MOTRIN) 200 MG tablet Take 200 mg by mouth as needed.     inFLIXimab-axxq (AVSOLA) 100 MG injection Inject into the vein every 8 (eight) weeks.     methotrexate (RHEUMATREX) 10 MG tablet Take 10 mg by mouth once a week. Caution: Chemotherapy. Protect from light.     predniSONE (DELTASONE) 10 MG tablet Take 10 mg by mouth as needed.     Current Facility-Administered Medications  Medication Dose Route Frequency Provider Last Rate Last Admin   0.9 %  sodium chloride infusion  500 mL Intravenous Continuous Mysty Kielty V, DO        Allergies as of 02/28/2023 - Review Complete 02/28/2023  Allergen Reaction Noted   Iodine  02/16/2014    Family History  Problem Relation Age of Onset   Breast cancer Mother 57   Hypertension Mother    Bone cancer Father 61   Ulcerative colitis Brother 12   Diabetes Brother    Kidney disease Brother    Diabetes Maternal Grandmother    Stomach cancer Maternal Grandmother    Gallbladder disease Daughter    Pancreatic cancer Half-Brother    Colon cancer Neg Hx    Colon polyps Neg Hx    Esophageal cancer Neg Hx    Rectal cancer Neg Hx     Social History   Socioeconomic History   Marital status: Married    Spouse name: Not on file   Number of children: 2   Years of education: Not on file   Highest education level: Not on file  Occupational History   Occupation: stay at home  Tobacco Use   Smoking status: Never   Smokeless tobacco: Never  Vaping Use   Vaping status: Never Used  Substance and Sexual Activity   Alcohol use: Yes    Alcohol/week: 0.0 standard drinks of alcohol    Comment: occ glass of wine   Drug use: No   Sexual activity: Yes    Birth control/protection:  Surgical  Other Topics Concern   Not on file  Social History Narrative   Not on file   Social Drivers of Health   Financial Resource Strain: Not on file  Food Insecurity: Not on file  Transportation Needs: Not on file  Physical Activity: Not on file  Stress: Not on file  Social Connections: Not on  file  Intimate Partner Violence: Not on file    Physical Exam: Vital signs in last 24 hours: @BP  (!) 165/105   Pulse 83   Temp 98.6 F (37 C)   Ht 5' 0.17" (1.528 m)   Wt 159 lb 6.4 oz (72.3 kg)   SpO2 100%   BMI 30.96 kg/m  GEN: NAD EYE: Sclerae anicteric ENT: MMM CV: Non-tachycardic Pulm: CTA b/l GI: Soft, NT/ND NEURO:  Alert & Oriented x 3   Doristine Locks, DO [facility]   02/28/2023 1:11 PM

## 2023-02-28 NOTE — Patient Instructions (Signed)
Await pathology results.  Continue present medications. Resume previous diet.  Handout on polyps provided.  YOU HAD AN ENDOSCOPIC PROCEDURE TODAY AT THE [facility]:   Refer to the procedure report that was given to you for any specific questions about what was found during the examination.  If the procedure report does not answer your questions, please call your gastroenterologist to clarify.  If you requested that your care partner not be given the details of your procedure findings, then the procedure report has been included in a sealed envelope for you to review at your convenience later.  YOU SHOULD EXPECT: Some feelings of bloating in the abdomen. Passage of more gas than usual.  Walking can help get rid of the air that was put into your GI tract during the procedure and reduce the bloating. If you had a lower endoscopy (such as a colonoscopy or flexible sigmoidoscopy) you may notice spotting of blood in your stool or on the toilet paper. If you underwent a bowel prep for your procedure, you may not have a normal bowel movement for a few days.  Please Note:  You might notice some irritation and congestion in your nose or some drainage.  This is from the oxygen used during your procedure.  There is no need for concern and it should clear up in a day or so.  SYMPTOMS TO REPORT IMMEDIATELY:  Following lower endoscopy (colonoscopy or flexible sigmoidoscopy):  Excessive amounts of blood in the stool  Significant tenderness or worsening of abdominal pains  Swelling of the abdomen that is new, acute  Fever of 100F or higher    For urgent or emergent issues, a gastroenterologist can be reached at any hour by calling [phone]. Do not use MyChart messaging for urgent concerns.    DIET:  We do recommend a small meal at first, but then you may proceed to your regular diet.  Drink plenty of fluids but you should avoid alcoholic beverages for 24 hours.  ACTIVITY:  You should  plan to take it easy for the rest of today and you should NOT DRIVE or use heavy machinery until tomorrow (because of the sedation medicines used during the test).    FOLLOW UP: Our staff will call the number listed on your records the next business day following your procedure.  We will call around 7:15- 8:00 am to check on you and address any questions or concerns that you may have regarding the information given to you following your procedure. If we do not reach you, we will leave a message.     If any biopsies were taken you will be contacted by phone or by letter within the next 1-3 weeks.  Please call us at [phone] if you have not heard about the biopsies in 3 weeks.    SIGNATURES/CONFIDENTIALITY: You and/or your care partner have signed paperwork which will be entered into your electronic medical record.  These signatures attest to the fact that that the information above on your After Visit Summary has been reviewed and is understood.  Full responsibility of the confidentiality of this discharge information lies with you and/or your care-partner.

## 2023-02-28 NOTE — Progress Notes (Signed)
Pt's states no medical or surgical changes since previsit or office visit. 

## 2023-02-28 NOTE — Op Note (Signed)
[facility] Patient Name: Laura Finley Procedure Date: 02/28/2023 1:15 PM MRN: [id number] Endoscopist: Doristine Locks , MD, [id number] Age: 47 Referring MD:  Date of Birth: 08/19/1975 Gender: Female Account #: [id number] Procedure:                Colonoscopy Indications:              Follow-up of ulcerative colitis. Currently in                            clinical remission with Avsola and is also                            prescribed MTX for concomitant ankylosing                            spondylitis. Last colonoscopy was 12/2017 and                            without active inflammation or dysplasia. Medicines:                Monitored Anesthesia Care Procedure:                Pre-Anesthesia Assessment:                           - Prior to the procedure, a History and Physical                            was performed, and patient medications and                            allergies were reviewed. The patient's tolerance of                            previous anesthesia was also reviewed. The risks                            and benefits of the procedure and the sedation                            options and risks were discussed with the patient.                            All questions were answered, and informed consent                            was obtained. Prior Anticoagulants: The patient has                            taken no anticoagulant or antiplatelet agents. ASA                            Grade Assessment: II - A patient with mild systemic  disease. After reviewing the risks and benefits,                            the patient was deemed in satisfactory condition to                            undergo the procedure.                           After obtaining informed consent, the colonoscope                            was passed under direct vision. Throughout the                            procedure, the patient's  blood pressure, pulse, and                            oxygen saturations were monitored continuously. The                            Olympus Scope SN: [id number] was introduced through                            the anus and advanced to the the terminal ileum.                            The colonoscopy was performed without difficulty.                            The patient tolerated the procedure well. The                            quality of the bowel preparation was good. The                            terminal ileum, ileocecal valve, appendiceal                            orifice, and rectum were photographed. Scope In: 1:35:28 PM Scope Out: 1:51:47 PM Scope Withdrawal Time: 0 hours 12 minutes 55 seconds  Total Procedure Duration: 0 hours 16 minutes 19 seconds  Findings:                 The perianal and digital rectal examinations were                            normal.                           A 3 mm polyp was found in the transverse colon. The                            polyp was sessile. The polyp was removed with a  cold snare. Resection and retrieval were complete.                            Estimated blood loss was minimal.                           Normal mucosa was found in the entire colon.                            Biopsies were taken with a cold forceps for                            histology. Estimated blood loss was minimal.                           The retroflexed view of the distal rectum and anal                            verge was normal and showed no anal or rectal                            abnormalities.                           The terminal ileum appeared normal. Complications:            No immediate complications. Estimated Blood Loss:     Estimated blood loss was minimal. Impression:               - One 3 mm polyp in the transverse colon, removed                            with a cold snare. Resected and retrieved.                            - Normal mucosa in the entire examined colon.                            Biopsied.                           - The distal rectum and anal verge are normal on                            retroflexion view.                           - The examined portion of the ileum was normal. Recommendation:           - Patient has a contact number available for                            emergencies. The signs and symptoms of potential  delayed complications were discussed with the                            patient. Return to normal activities tomorrow.                            Written discharge instructions were provided to the                            patient.                           - Resume previous diet.                           - Continue present medications.                           - Await pathology results.                           - Repeat colonoscopy for surveillance based on                            pathology results.                           - Return to GI clinic in 1 year. Doristine Locks, MD 02/28/2023 2:02:02 PM

## 2023-02-28 NOTE — Progress Notes (Signed)
Vss nat trans to pacu

## 2023-03-03 ENCOUNTER — Telehealth: Payer: Self-pay

## 2023-03-03 NOTE — Telephone Encounter (Signed)
  Follow up Call-     02/28/2023   12:51 PM  Call back number  Post procedure Call Back phone  # [phone]  Permission to leave phone message Yes     Patient questions:  Do you have a fever, pain , or abdominal swelling? No. Pain Score  0 *  Have you tolerated food without any problems? Yes.    Have you been able to return to your normal activities? Yes.    Do you have any questions about your discharge instructions: Diet   No. Medications  No. Follow up visit  No.  Do you have questions or concerns about your Care? No.  Actions: * If pain score is 4 or above: No action needed, pain <4.

## 2023-03-05 LAB — SURGICAL PATHOLOGY

## 2023-03-26 ENCOUNTER — Other Ambulatory Visit: Payer: Self-pay | Admitting: Nurse Practitioner

## 2023-03-26 DIAGNOSIS — F411 Generalized anxiety disorder: Secondary | ICD-10-CM

## 2023-04-25 ENCOUNTER — Ambulatory Visit: Payer: 59 | Admitting: Nurse Practitioner

## 2023-04-28 ENCOUNTER — Other Ambulatory Visit: Payer: 59

## 2023-04-28 DIAGNOSIS — E785 Hyperlipidemia, unspecified: Secondary | ICD-10-CM

## 2023-04-29 ENCOUNTER — Encounter: Payer: Self-pay | Admitting: Nurse Practitioner

## 2023-04-29 LAB — COMPLETE METABOLIC PANEL WITH GFR
AG Ratio: 1.2 (calc) (ref 1.0–2.5)
ALT: 15 U/L (ref 6–29)
AST: 15 U/L (ref 10–35)
Albumin: 4.2 g/dL (ref 3.6–5.1)
Alkaline phosphatase (APISO): 45 U/L (ref 31–125)
BUN: 16 mg/dL (ref 7–25)
CO2: 25 mmol/L (ref 20–32)
Calcium: 9.5 mg/dL (ref 8.6–10.2)
Chloride: 105 mmol/L (ref 98–110)
Creat: 0.81 mg/dL (ref 0.50–0.99)
Globulin: 3.5 g/dL (ref 1.9–3.7)
Glucose, Bld: 96 mg/dL (ref 65–99)
Potassium: 4.2 mmol/L (ref 3.5–5.3)
Sodium: 137 mmol/L (ref 135–146)
Total Bilirubin: 0.4 mg/dL (ref 0.2–1.2)
Total Protein: 7.7 g/dL (ref 6.1–8.1)
eGFR: 90 mL/min/{1.73_m2} (ref >=60)

## 2023-04-29 LAB — LIPID PANEL
Cholesterol: 218 mg/dL — ABNORMAL HIGH (ref <200)
HDL: 61 mg/dL (ref >=50)
LDL Cholesterol (Calc): 126 mg/dL — ABNORMAL HIGH
Non-HDL Cholesterol (Calc): 157 mg/dL — ABNORMAL HIGH (ref <130)
Total CHOL/HDL Ratio: 3.6 (calc) (ref <5.0)
Triglycerides: 190 mg/dL — ABNORMAL HIGH (ref <150)

## 2023-04-30 ENCOUNTER — Encounter: Payer: Self-pay | Admitting: Nurse Practitioner

## 2023-04-30 ENCOUNTER — Ambulatory Visit: Payer: 59 | Admitting: Nurse Practitioner

## 2023-04-30 VITALS — BP 160/100 | HR 80 | Temp 98.2°F | Resp 20 | Ht 64.0 in | Wt 162.4 lb

## 2023-04-30 DIAGNOSIS — R030 Elevated blood-pressure reading, without diagnosis of hypertension: Secondary | ICD-10-CM | POA: Diagnosis not present

## 2023-04-30 DIAGNOSIS — E785 Hyperlipidemia, unspecified: Secondary | ICD-10-CM | POA: Diagnosis not present

## 2023-04-30 DIAGNOSIS — F411 Generalized anxiety disorder: Secondary | ICD-10-CM

## 2023-04-30 DIAGNOSIS — M459 Ankylosing spondylitis of unspecified sites in spine: Secondary | ICD-10-CM

## 2023-04-30 DIAGNOSIS — K5190 Ulcerative colitis, unspecified, without complications: Secondary | ICD-10-CM

## 2023-04-30 NOTE — Progress Notes (Signed)
Careteam: Patient Care Team: Sharon Seller, NP as PCP - General (Nurse Practitioner)  PLACE OF SERVICE:  [facility]  Advanced Directive information    Allergies  Allergen Reactions   Iodine     Rash on face, coughing,    Chief Complaint  Patient presents with   Follow-up    HPI: pt is a 48 y.o. female for routine follow up Discussed the use of AI scribe software for clinical note transcription with the patient, who gave verbal consent to proceed.  History of Present Illness    Her cholesterol levels remain elevated but have improved compared to last year. She initially had difficulty tolerating Crestor but is now taking half a pill daily without issues.  Her blood pressure was elevated during the visit, but at home, it typically ranges between 135-145/90 mmHg. She attributes higher readings in the office to 'white coat syndrome' and experiences a sensation when informed of high blood pressure. No headaches, blurred vision, palpitations, chest pain, or significant shortness of breath, except occasionally when climbing stairs, which resolves quickly.  She is under the care of a rheumatologist for ankylosing spondylitis and GI due to ulcerative colitis. She has an upcoming appointment and an infusion scheduled. No recent symptoms of ulcerative colitis and a recent colonoscopy showed good results. Daily stretching exercises for 25 minutes help manage morning stiffness and improve daily function.  She reports increased urination over the past week, especially at night, without burning sensation or abdominal pain, attributing it to increased water intake. Her urine is clear.  She is taking Zoloft 50 mg and feels well with no significant anxiety, attributing her improved condition to dietary changes, although she has not lost weight.        Review of Systems:  Review of Systems  Constitutional:  Negative for chills, fever and weight loss.  HENT:  Negative for tinnitus.    Respiratory:  Negative for cough, sputum production and shortness of breath.   Cardiovascular:  Negative for chest pain, palpitations and leg swelling.  Gastrointestinal:  Negative for abdominal pain, constipation, diarrhea and heartburn.  Genitourinary:  Negative for dysuria, frequency and urgency.  Musculoskeletal:  Negative for back pain, falls, joint pain and myalgias.  Skin: Negative.   Neurological:  Negative for dizziness and headaches.  Psychiatric/Behavioral:  Negative for depression and memory loss. The patient does not have insomnia.     Past Medical History:  Diagnosis Date   ANA positive    Anxiety    Arthritis 2012   reactive arthritis   Foot pain    Hand pain    High risk medication use    History of kidney disease as a child    Hyperlipidemia    Osteoarthritis    c spine   Raynaud's disease    Sacroiliitis (HCC)    Sicca complex (HCC)    Sjogren's syndrome (HCC)    Spondyloarthritis    Ulcerative colitis (HCC)    Past Surgical History:  Procedure Laterality Date   COLONOSCOPY     CYSTOSCOPY  1985   with urethral dialtion   TUBAL LIGATION  2003   Social History:   reports that she has never smoked. She has never used smokeless tobacco. She reports current alcohol use. She reports that she does not use drugs.  Family History  Problem Relation Age of Onset   Breast cancer Mother 17   Hypertension Mother    Bone cancer Father 22   Ulcerative colitis Brother  41   Diabetes Brother    Kidney disease Brother    Diabetes Maternal Grandmother    Stomach cancer Maternal Grandmother    Gallbladder disease Daughter    Pancreatic cancer Half-Brother    Colon cancer Neg Hx    Colon polyps Neg Hx    Esophageal cancer Neg Hx    Rectal cancer Neg Hx     Medications: Patient's Medications  New Prescriptions   No medications on file  Previous Medications   ACETAMINOPHEN (TYLENOL) 500 MG TABLET    Take 1,000 mg by mouth every 12 (twelve) hours as needed.    BIOTIN 10 MG TABS    Take 10 mg by mouth daily.   CETIRIZINE (ZYRTEC) 10 MG TABLET    Take 10 mg by mouth as needed.    CHOLECALCIFEROL (VITAMIN D PO)    Take 1 tablet by mouth daily.   CYANOCOBALAMIN (VITAMIN B-12 PO)    Take 1 tablet by mouth daily.   DICLOFENAC SODIUM (VOLTAREN) 1 % GEL    Apply topically as needed.   FOLIC ACID (FOLVITE) 1 MG TABLET    Take 1 mg by mouth daily.   IBUPROFEN (ADVIL,MOTRIN) 200 MG TABLET    Take 200 mg by mouth as needed.   INFLIXIMAB-AXXQ (AVSOLA) 100 MG INJECTION    Inject into the vein every 8 (eight) weeks.   LEUCOVORIN (WELLCOVORIN) 5 MG TABLET    Take 5 mg by mouth once a week.   MELATONIN PO    Take 2 mg by mouth daily.   METHOTREXATE (RHEUMATREX) 10 MG TABLET    Take 10 mg by mouth once a week. Caution: Chemotherapy. Protect from light.   NORETHINDRONE-ETHINYL ESTRADIOL-FE BIPHAS (LO LOESTRIN FE) 1 MG-10 MCG / 10 MCG TABLET    Take 1 tablet by mouth daily.   PREDNISONE (DELTASONE) 10 MG TABLET    Take 10 mg by mouth as needed.   ROSUVASTATIN (CRESTOR) 5 MG TABLET    Take 0.5 tablets (2.5 mg total) by mouth daily.   SERTRALINE (ZOLOFT) 50 MG TABLET    TAKE 1 TABLET BY MOUTH EVERY DAY  Modified Medications   No medications on file  Discontinued Medications   No medications on file    Physical Exam:  Vitals:   04/30/23 1030  BP: (!) 180/110  Pulse: 80  Resp: 20  Temp: 98.2 F (36.8 C)  TempSrc: Temporal  SpO2: 99%  Weight: 162 lb 6.4 oz (73.7 kg)  Height: 5\' 4"  (1.626 m)   Body mass index is 27.88 kg/m. Wt Readings from Last 3 Encounters:  04/30/23 162 lb 6.4 oz (73.7 kg)  02/28/23 159 lb 6.4 oz (72.3 kg)  12/09/22 159 lb 4 oz (72.2 kg)    Physical Exam Constitutional:      General: She is not in acute distress.    Appearance: She is well-developed. She is not diaphoretic.  HENT:     Head: Normocephalic and atraumatic.     Mouth/Throat:     Pharynx: No oropharyngeal exudate.  Eyes:     Conjunctiva/sclera: Conjunctivae normal.      Pupils: Pupils are equal, round, and reactive to light.  Cardiovascular:     Rate and Rhythm: Normal rate and regular rhythm.     Heart sounds: Normal heart sounds.  Pulmonary:     Effort: Pulmonary effort is normal.     Breath sounds: Normal breath sounds.  Abdominal:     General: Bowel sounds are normal.  Palpations: Abdomen is soft.  Musculoskeletal:     Cervical back: Normal range of motion and neck supple.     Right lower leg: No edema.     Left lower leg: No edema.  Skin:    General: Skin is warm and dry.  Neurological:     Mental Status: She is alert.  Psychiatric:        Mood and Affect: Mood normal.     Labs reviewed: Basic Metabolic Panel: Recent Labs    10/07/22 1023 12/11/22 0818 04/28/23 0952  NA 139 139 137  K 4.6 4.3 4.2  CL 105 107 105  CO2 26 24 25   GLUCOSE 96 88 96  BUN 26* 17 16  CREATININE 0.81 0.86 0.81  CALCIUM 9.8 9.5 9.5   Liver Function Tests: Recent Labs    10/07/22 1023 12/11/22 0818 04/28/23 0952  AST 19 14 15   ALT 18 15 15   BILITOT 0.3 0.4 0.4  PROT 7.8 7.5 7.7   No results for input(s): "LIPASE", "AMYLASE" in the last 8760 hours. No results for input(s): "AMMONIA" in the last 8760 hours. CBC: Recent Labs    10/07/22 1023  WBC 5.4  NEUTROABS 3,510  HGB 13.0  HCT 40.2  MCV 91.4  PLT 279   Lipid Panel: Recent Labs    10/07/22 1023 12/11/22 0818 04/28/23 0952  CHOL 252* 213* 218*  HDL 61 57 61  LDLCALC 155* 127* 126*  TRIG 199* 169* 190*  CHOLHDL 4.1 3.7 3.6   TSH: No results for input(s): "TSH" in the last 8760 hours. A1C: No results found for: "HGBA1C"   Assessment/Plan Assessment and Plan    Hyperlipidemia Elevated cholesterol despite Crestor therapy. Patient tolerating half a pill daily without side effects. Cholesterol improved compared to last year but ideall LDL <100 -Continue Crestor at current dose with dietary modifications- she has implemented low fat diet. -Recheck cholesterol in 6  months.  Elevated blood pressure reading Elevated office blood pressure reading, but patient reports normal readings at home and possible white coat syndrome. No symptoms of hypertensive urgency. -Request patient to record home blood pressure readings for 1-2 weeks and report back. -Recheck blood pressure in office in 6 months.  Ankylosing Spondylitis Managed by rheumatologist with infusions. Patient reports improvement in joint pain and stiffness. -Continue current management with rheumatologist. -Obtain updated records from rheumatologist.  Ulcerative Colitis Managed by gastroenterologist. No recent symptoms. -Continue current management with gastroenterologist. -Obtain updated records from gastroenterologist.  Anxiety -Continue Zoloft 50mg  for anxiety -with lifestyle modifications  General Health Maintenance -Ensure up-to-date with influenza and COVID-19 vaccinations. -Follow-up in 6 months with pre-appointment labs.     Return in about 6 months (around 10/28/2023) for routine follow up, labs prior to visit.:  Karolee Meloni K. Biagio Borg [facility] [phone]

## 2023-04-30 NOTE — Patient Instructions (Addendum)
To sign record release for [facility] rheumatologist And to sign for GYN- Dr Langston Masker also needs records of mamogram   To check blood pressure 1 hour AFTER you have had your medication Make sure you have been sitting at least 5 mins  Record and let us know.  Goal <140/90 Send via mychart after about 5 or 6 readings

## 2023-05-12 MED ORDER — LOSARTAN POTASSIUM 25 MG PO TABS: 25.0000 mg | ORAL_TABLET | Freq: Every day | ORAL | 1 refills | Status: DC

## 2023-06-04 ENCOUNTER — Other Ambulatory Visit: Payer: Self-pay | Admitting: Nurse Practitioner

## 2023-06-23 ENCOUNTER — Encounter: Payer: Self-pay | Admitting: Nurse Practitioner

## 2023-06-23 ENCOUNTER — Ambulatory Visit: Admitting: Nurse Practitioner

## 2023-06-23 VITALS — BP 158/98 | HR 74 | Temp 97.8°F | Resp 16 | Ht <= 58 in | Wt 160.6 lb

## 2023-06-23 DIAGNOSIS — E785 Hyperlipidemia, unspecified: Secondary | ICD-10-CM

## 2023-06-23 DIAGNOSIS — I10 Essential (primary) hypertension: Secondary | ICD-10-CM | POA: Diagnosis not present

## 2023-06-23 MED ORDER — LOSARTAN POTASSIUM 50 MG PO TABS: 50.0000 mg | ORAL_TABLET | Freq: Every day | ORAL | 0 refills | Status: DC

## 2023-06-23 NOTE — Progress Notes (Unsigned)
 Careteam: Patient Care Team: Sharon Seller, NP as PCP - General (Nurse Practitioner)  PLACE OF SERVICE:  [facility]  Advanced Directive information    Allergies  Allergen Reactions   Iodine     Rash on face, coughing,    Chief Complaint  Patient presents with   Medical Management of Chronic Issues    Medication refill.     HPI: pt is a 48 y.o. female *** Discussed the use of AI scribe software for clinical note transcription with the patient, who gave verbal consent to proceed.  History of Present Illness     Review of Systems:  ROS***  Past Medical History:  Diagnosis Date   ANA positive    Anxiety    Arthritis 2012   reactive arthritis   Foot pain    Hand pain    High risk medication use    History of kidney disease as a child    Hyperlipidemia    Osteoarthritis    c spine   Raynaud's disease    Sacroiliitis (HCC)    Sicca complex (HCC)    Sjogren's syndrome (HCC)    Spondyloarthritis    Ulcerative colitis (HCC)    Past Surgical History:  Procedure Laterality Date   COLONOSCOPY     CYSTOSCOPY  1985   with urethral dialtion   TUBAL LIGATION  2003   Social History:   reports that she has never smoked. She has never used smokeless tobacco. She reports current alcohol use. She reports that she does not use drugs.  Family History  Problem Relation Age of Onset   Breast cancer Mother 48   Hypertension Mother    Bone cancer Father 60   Ulcerative colitis Brother 57   Diabetes Brother    Kidney disease Brother    Diabetes Maternal Grandmother    Stomach cancer Maternal Grandmother    Gallbladder disease Daughter    Pancreatic cancer Half-Brother    Colon cancer Neg Hx    Colon polyps Neg Hx    Esophageal cancer Neg Hx    Rectal cancer Neg Hx     Medications: Patient's Medications  New Prescriptions   No medications on file  Previous Medications   ACETAMINOPHEN (TYLENOL) 500 MG TABLET    Take 1,000 mg by mouth every 12 (twelve)  hours as needed.   BIOTIN 10 MG TABS    Take 10 mg by mouth daily.   CETIRIZINE (ZYRTEC) 10 MG TABLET    Take 10 mg by mouth as needed.    CHOLECALCIFEROL (VITAMIN D PO)    Take 1 tablet by mouth daily.   CYANOCOBALAMIN (VITAMIN B-12 PO)    Take 1 tablet by mouth daily.   DICLOFENAC SODIUM (VOLTAREN) 1 % GEL    Apply topically as needed.   FOLIC ACID (FOLVITE) 1 MG TABLET    Take 1 mg by mouth daily.   IBUPROFEN (ADVIL,MOTRIN) 200 MG TABLET    Take 200 mg by mouth as needed.   INFLIXIMAB-AXXQ (AVSOLA) 100 MG INJECTION    Inject into the vein every 8 (eight) weeks.   LEUCOVORIN (WELLCOVORIN) 5 MG TABLET    Take 5 mg by mouth once a week.   LOSARTAN (COZAAR) 25 MG TABLET    TAKE 1 TABLET (25 MG TOTAL) BY MOUTH DAILY.   MELATONIN PO    Take 2 mg by mouth daily.   METHOTREXATE (RHEUMATREX) 10 MG TABLET    Take 10 mg by mouth once  a week. Caution: Chemotherapy. Protect from light.   NORETHINDRONE-ETHINYL ESTRADIOL-FE BIPHAS (LO LOESTRIN FE) 1 MG-10 MCG / 10 MCG TABLET    Take 1 tablet by mouth daily.   PREDNISONE (DELTASONE) 10 MG TABLET    Take 10 mg by mouth as needed.   ROSUVASTATIN (CRESTOR) 5 MG TABLET    Take 0.5 tablets (2.5 mg total) by mouth daily.   SERTRALINE (ZOLOFT) 50 MG TABLET    TAKE 1 TABLET BY MOUTH EVERY DAY  Modified Medications   No medications on file  Discontinued Medications   No medications on file    Physical Exam:  Vitals:   06/23/23 1050  Pulse: 74  Resp: 16  Temp: 97.8 F (36.6 C)  SpO2: 99%  Weight: 160 lb 9.6 oz (72.8 kg)  Height: 1' (0.305 m)   Body mass index is 784.13 kg/m. Wt Readings from Last 3 Encounters:  06/23/23 160 lb 9.6 oz (72.8 kg)  04/30/23 162 lb 6.4 oz (73.7 kg)  02/28/23 159 lb 6.4 oz (72.3 kg)    Physical Exam***  Labs reviewed: Basic Metabolic Panel: Recent Labs    10/07/22 1023 12/11/22 0818 04/28/23 0952  NA 139 139 137  K 4.6 4.3 4.2  CL 105 107 105  CO2 26 24 25   GLUCOSE 96 88 96  BUN 26* 17 16  CREATININE  0.81 0.86 0.81  CALCIUM 9.8 9.5 9.5   Liver Function Tests: Recent Labs    10/07/22 1023 12/11/22 0818 04/28/23 0952  AST 19 14 15   ALT 18 15 15   BILITOT 0.3 0.4 0.4  PROT 7.8 7.5 7.7   No results for input(s): "LIPASE", "AMYLASE" in the last 8760 hours. No results for input(s): "AMMONIA" in the last 8760 hours. CBC: Recent Labs    10/07/22 1023  WBC 5.4  NEUTROABS 3,510  HGB 13.0  HCT 40.2  MCV 91.4  PLT 279   Lipid Panel: Recent Labs    10/07/22 1023 12/11/22 0818 04/28/23 0952  CHOL 252* 213* 218*  HDL 61 57 61  LDLCALC 155* 127* 126*  TRIG 199* 169* 190*  CHOLHDL 4.1 3.7 3.6   TSH: No results for input(s): "TSH" in the last 8760 hours. A1C: No results found for: "HGBA1C"   Assessment/Plan Assessment and Plan Assessment & Plan    *** There are no diagnoses linked to this encounter.   No follow-ups on file.: ***  Prashant Glosser K. Biagio Borg [facility] [phone]

## 2023-09-16 ENCOUNTER — Other Ambulatory Visit: Payer: Self-pay | Admitting: Nurse Practitioner

## 2023-09-16 DIAGNOSIS — F411 Generalized anxiety disorder: Secondary | ICD-10-CM

## 2023-09-16 NOTE — Telephone Encounter (Signed)
 Patient has request a refill on medication Sertraline . Medication last refilled in January 2025. Patient medication has High risk warning. Medication pend and sent to PCP Caro Jessica K, NP for approval.

## 2023-09-20 ENCOUNTER — Other Ambulatory Visit: Payer: Self-pay | Admitting: Nurse Practitioner

## 2023-09-22 ENCOUNTER — Ambulatory Visit: Admitting: Nurse Practitioner

## 2023-10-23 ENCOUNTER — Other Ambulatory Visit: Payer: 59

## 2023-10-23 DIAGNOSIS — E785 Hyperlipidemia, unspecified: Secondary | ICD-10-CM

## 2023-10-23 DIAGNOSIS — M459 Ankylosing spondylitis of unspecified sites in spine: Secondary | ICD-10-CM

## 2023-10-23 DIAGNOSIS — K5190 Ulcerative colitis, unspecified, without complications: Secondary | ICD-10-CM

## 2023-10-23 LAB — LIPID PANEL
Cholesterol: 223 mg/dL — ABNORMAL HIGH (ref <200)
HDL: 62 mg/dL (ref >=50)
LDL Cholesterol (Calc): 122 mg/dL — ABNORMAL HIGH
Non-HDL Cholesterol (Calc): 161 mg/dL — ABNORMAL HIGH (ref <130)
Total CHOL/HDL Ratio: 3.6 (calc) (ref <5.0)
Triglycerides: 239 mg/dL — ABNORMAL HIGH (ref <150)

## 2023-10-23 LAB — COMPREHENSIVE METABOLIC PANEL WITH GFR
AG Ratio: 1.3 (calc) (ref 1.0–2.5)
ALT: 20 U/L (ref 6–29)
AST: 18 U/L (ref 10–35)
Albumin: 4.2 g/dL (ref 3.6–5.1)
Alkaline phosphatase (APISO): 45 U/L (ref 31–125)
BUN: 20 mg/dL (ref 7–25)
CO2: 25 mmol/L (ref 20–32)
Calcium: 9.5 mg/dL (ref 8.6–10.2)
Chloride: 104 mmol/L (ref 98–110)
Creat: 0.75 mg/dL (ref 0.50–0.99)
Globulin: 3.3 g/dL (ref 1.9–3.7)
Glucose, Bld: 86 mg/dL (ref 65–99)
Potassium: 4.4 mmol/L (ref 3.5–5.3)
Sodium: 137 mmol/L (ref 135–146)
Total Bilirubin: 0.4 mg/dL (ref 0.2–1.2)
Total Protein: 7.5 g/dL (ref 6.1–8.1)
eGFR: 98 mL/min/1.73m2 (ref >=60)

## 2023-10-23 LAB — CBC WITH DIFFERENTIAL/PLATELET
Absolute Lymphocytes: 1073 {cells}/uL (ref 850–3900)
Absolute Monocytes: 412 {cells}/uL (ref 200–950)
Basophils Absolute: 39 {cells}/uL (ref 0–200)
Basophils Relative: 0.8 %
Eosinophils Absolute: 201 {cells}/uL (ref 15–500)
Eosinophils Relative: 4.1 %
HCT: 39.5 % (ref 35.0–45.0)
Hemoglobin: 12.8 g/dL (ref 11.7–15.5)
MCH: 30.2 pg (ref 27.0–33.0)
MCHC: 32.4 g/dL (ref 32.0–36.0)
MCV: 93.2 fL (ref 80.0–100.0)
MPV: 10.8 fL (ref 7.5–12.5)
Monocytes Relative: 8.4 %
Neutro Abs: 3175 {cells}/uL (ref 1500–7800)
Neutrophils Relative %: 64.8 %
Platelets: 309 Thousand/uL (ref 140–400)
RBC: 4.24 Million/uL (ref 3.80–5.10)
RDW: 13.4 % (ref 11.0–15.0)
Total Lymphocyte: 21.9 %
WBC: 4.9 Thousand/uL (ref 3.8–10.8)

## 2023-10-27 ENCOUNTER — Ambulatory Visit: Payer: Self-pay | Admitting: Nurse Practitioner

## 2023-10-31 ENCOUNTER — Ambulatory Visit: Payer: 59 | Admitting: Nurse Practitioner

## 2023-10-31 ENCOUNTER — Encounter: Payer: Self-pay | Admitting: Nurse Practitioner

## 2023-10-31 VITALS — BP 160/109 | HR 72 | Temp 98.2°F | Ht 64.0 in | Wt 164.4 lb

## 2023-10-31 DIAGNOSIS — E785 Hyperlipidemia, unspecified: Secondary | ICD-10-CM

## 2023-10-31 DIAGNOSIS — F411 Generalized anxiety disorder: Secondary | ICD-10-CM

## 2023-10-31 DIAGNOSIS — I10 Essential (primary) hypertension: Secondary | ICD-10-CM | POA: Diagnosis not present

## 2023-10-31 DIAGNOSIS — K5190 Ulcerative colitis, unspecified, without complications: Secondary | ICD-10-CM | POA: Diagnosis not present

## 2023-10-31 DIAGNOSIS — N951 Menopausal and female climacteric states: Secondary | ICD-10-CM

## 2023-10-31 DIAGNOSIS — M069 Rheumatoid arthritis, unspecified: Secondary | ICD-10-CM

## 2023-10-31 DIAGNOSIS — M459 Ankylosing spondylitis of unspecified sites in spine: Secondary | ICD-10-CM

## 2023-10-31 MED ORDER — ROSUVASTATIN CALCIUM 5 MG PO TABS: 5.0000 mg | ORAL_TABLET | Freq: Every day | ORAL | 1 refills | Status: DC

## 2023-10-31 NOTE — Patient Instructions (Addendum)
 Please visit your local pharmacy to receive your covid and flu vaccine.  Look for therapist- some places to consider  [facility] [address] [place], [place] [address] Phone: [phone]  [facility] [address] [place], [place] [address] Phone: [phone]  [facility] [address] [place], [place] [address] Phone [phone]  [facility] Skinner, [place], [place], [place] [address] [place], [place] [address] [phone] In home visit available with prior arrangement

## 2023-10-31 NOTE — Progress Notes (Unsigned)
 Careteam: Patient Care Team: Caro Harlene POUR, NP as PCP - General (Nurse Practitioner)  PLACE OF SERVICE:  [facility]  Advanced Directive information    Allergies  Allergen Reactions  . Iodine     Rash on face, coughing,    Chief Complaint  Patient presents with  . Medical Management of Chronic Issues    Patient states that she is not sleeping well through the night. Patient will schedule her mammogram with her provider at [facility].    HPI:  Discussed the use of AI scribe software for clinical note transcription with the patient, who gave verbal consent to proceed.  History of Present Illness Laura Finley is a 48 year old female with hypertension and hyperlipidemia who presents for a follow-up visit.  She is managing her hypertension with medication and reports that her blood pressure was stable during her last infusion session. She is taking her antihypertensive medication as prescribed.  For hyperlipidemia, she is on Crestor  2.5 mg daily. Her recent blood work indicates that her cholesterol levels are not at the desired target, although they are not critically high. She denies any side effects from the medication.  She has a history of ulcerative colitis, which is stable with regular infusions every eight weeks. She has used prednisone recently to manage inflammation, attributing the need to stress and physical activity related to family visits and personal events.  She experiences mood swings and increased tiredness, particularly around her menstrual cycle, which she associates with perimenopause. She is on Lolo for this condition and reports feeling depressed and anxious during this time, with negative thoughts and a sense of impending doom, which improves after her period. She lacks a support system, as she spends much time alone and feels burdened by caring for her in-laws.  Her bowel movements are regular, but she notes difficulty  sleeping and increased anxiety, which she attributes to mood swings and stress.  ***  Review of Systems:  ROS***  Past Medical History:  Diagnosis Date  . ANA positive   . Anxiety   . Arthritis 2012   reactive arthritis  . Foot pain   . Hand pain   . High risk medication use   . History of kidney disease as a child   . Hyperlipidemia   . Osteoarthritis    c spine  . Raynaud's disease   . Sacroiliitis (HCC)   . Sicca complex (HCC)   . Sjogren's syndrome (HCC)   . Spondyloarthritis   . Ulcerative colitis [facility])    Past Surgical History:  Procedure Laterality Date  . COLONOSCOPY    . CYSTOSCOPY  1985   with urethral dialtion  . TUBAL LIGATION  2003   Social History:   reports that she has never smoked. She has never used smokeless tobacco. She reports current alcohol use. She reports that she does not use drugs.  Family History  Problem Relation Age of Onset  . Breast cancer Mother 74  . Hypertension Mother   . Bone cancer Father 30  . Ulcerative colitis Brother 41  . Diabetes Brother   . Kidney disease Brother   . Diabetes Maternal Grandmother   . Stomach cancer Maternal Grandmother   . Gallbladder disease Daughter   . Pancreatic cancer Half-Brother   . Colon cancer Neg Hx   . Colon polyps Neg Hx   . Esophageal cancer Neg Hx   . Rectal cancer Neg Hx     Medications: Patient's Medications  New Prescriptions   No medications on file  Previous Medications   ACETAMINOPHEN (TYLENOL) 500 MG TABLET    Take 1,000 mg by mouth every 12 (twelve) hours as needed.   BIOTIN 10 MG TABS    Take 10 mg by mouth daily.   CETIRIZINE (ZYRTEC) 10 MG TABLET    Take 10 mg by mouth as needed.    CHOLECALCIFEROL (VITAMIN D PO)    Take 1 tablet by mouth daily.   CYANOCOBALAMIN (VITAMIN B-12 PO)    Take 1 tablet by mouth daily.   DICLOFENAC SODIUM (VOLTAREN) 1 % GEL    Apply topically as needed.   FOLIC ACID (FOLVITE) 1 MG TABLET    Take 1 mg by mouth daily.   IBUPROFEN  (ADVIL,MOTRIN) 200 MG TABLET    Take 200 mg by mouth as needed.   INFLIXIMAB -AXXQ (AVSOLA ) 100 MG INJECTION    Inject into the vein every 8 (eight) weeks.   LEUCOVORIN (WELLCOVORIN) 5 MG TABLET    Take 5 mg by mouth once a week.   LOSARTAN  (COZAAR ) 50 MG TABLET    TAKE 1 TABLET BY MOUTH EVERY DAY   MELATONIN PO    Take 2 mg by mouth daily.   METHOTREXATE (RHEUMATREX) 10 MG TABLET    Take 10 mg by mouth once a week. Caution: Chemotherapy. Protect from light.   NORETHINDRONE-ETHINYL ESTRADIOL-FE BIPHAS (LO LOESTRIN FE) 1 MG-10 MCG / 10 MCG TABLET    Take 1 tablet by mouth daily.   PREDNISONE (DELTASONE) 10 MG TABLET    Take 10 mg by mouth as needed.   ROSUVASTATIN  (CRESTOR ) 5 MG TABLET    Take 0.5 tablets (2.5 mg total) by mouth daily.   SERTRALINE  (ZOLOFT ) 50 MG TABLET    TAKE 1 TABLET BY MOUTH EVERY DAY  Modified Medications   No medications on file  Discontinued Medications   No medications on file    Physical Exam:  Vitals:   10/31/23 1050  BP: (!) 140/110  Pulse: 72  Temp: 98.2 F (36.8 C)  SpO2: 98%  Weight: 164 lb 6.4 oz (74.6 kg)  Height: 5' 4 (1.626 m)   Body mass index is 28.22 kg/m.+ Wt Readings from Last 3 Encounters:  06/23/23 160 lb 9.6 oz (72.8 kg)  04/30/23 162 lb 6.4 oz (73.7 kg)  02/28/23 159 lb 6.4 oz (72.3 kg)    Physical Exam***  Labs reviewed: Basic Metabolic Panel: Recent Labs    12/11/22 0818 04/28/23 0952 10/23/23 0857  NA 139 137 137  K 4.3 4.2 4.4  CL 107 105 104  CO2 24 25 25   GLUCOSE 88 96 86  BUN 17 16 20   CREATININE 0.86 0.81 0.75  CALCIUM  9.5 9.5 9.5   Liver Function Tests: Recent Labs    12/11/22 0818 04/28/23 0952 10/23/23 0857  AST 14 15 18   ALT 15 15 20   BILITOT 0.4 0.4 0.4  PROT 7.5 7.7 7.5   No results for input(s): LIPASE, AMYLASE in the last 8760 hours. No results for input(s): AMMONIA in the last 8760 hours. CBC: Recent Labs    10/23/23 0857  WBC 4.9  NEUTROABS 3,175  HGB 12.8  HCT 39.5  MCV 93.2   PLT 309   Lipid Panel: Recent Labs    12/11/22 0818 04/28/23 0952 10/23/23 0857  CHOL 213* 218* 223*  HDL 57 61 62  LDLCALC 127* 126* 122*  TRIG 169* 190* 239*  CHOLHDL 3.7 3.6 3.6   TSH: No  results for input(s): TSH in the last 8760 hours. A1C: No results found for: HGBA1C   Assessment/Plan *** There are no diagnoses linked to this encounter.   No follow-ups on file.: ***  Jamesen Stahnke K. Caro BODILY [facility] [phone]

## 2023-11-05 DIAGNOSIS — I10 Essential (primary) hypertension: Secondary | ICD-10-CM | POA: Insufficient documentation

## 2023-11-05 DIAGNOSIS — N951 Menopausal and female climacteric states: Secondary | ICD-10-CM | POA: Insufficient documentation

## 2023-11-05 NOTE — Assessment & Plan Note (Signed)
 Cholesterol not at target. On rosuvastatin  2.5 mg daily without side effects. Titration cautious due to inflammation risk and pain.  - Alternate rosuvastatin  2.5 mg and 5 mg every other day. - Increase to 5 mg daily if tolerated after a few weeks to a month. - Sent prescription for rosuvastatin  to pharmacy. - Check cholesterol levels before next appointment in six months.

## 2023-11-05 NOTE — Assessment & Plan Note (Signed)
 Followed by rheumatology, continues infusion and prednisone PRN

## 2023-11-05 NOTE — Assessment & Plan Note (Signed)
 Bp typically well controlled, will cont current regimen Has bp checked routinely at rheumatologist.  Continue current medications and dietary modifications follow metabolic panel

## 2023-11-05 NOTE — Assessment & Plan Note (Signed)
 Ongoing, continues on zoloft  Discussed therapy options and information provided.

## 2023-11-05 NOTE — Assessment & Plan Note (Signed)
No recent flares, followed by GI

## 2023-11-05 NOTE — Assessment & Plan Note (Signed)
 Mood swings and depressive symptoms, especially around menstrual cycle. Stress and lack of support contribute. Continues to follow up with GYN Recommend routine therapy

## 2023-11-17 ENCOUNTER — Other Ambulatory Visit: Payer: Self-pay | Admitting: Nurse Practitioner

## 2023-12-23 ENCOUNTER — Other Ambulatory Visit: Payer: Self-pay | Admitting: Nurse Practitioner

## 2024-03-04 ENCOUNTER — Other Ambulatory Visit: Payer: Self-pay | Admitting: Nurse Practitioner

## 2024-03-04 DIAGNOSIS — F411 Generalized anxiety disorder: Secondary | ICD-10-CM

## 2024-03-04 NOTE — Telephone Encounter (Signed)
 High risk warning populated when attempting to refill medications, will send to provider for pending  review and approval

## 2024-04-18 ENCOUNTER — Other Ambulatory Visit: Payer: Self-pay | Admitting: Nurse Practitioner

## 2024-04-18 DIAGNOSIS — E785 Hyperlipidemia, unspecified: Secondary | ICD-10-CM

## 2024-05-04 ENCOUNTER — Other Ambulatory Visit: Payer: Self-pay

## 2024-05-07 ENCOUNTER — Ambulatory Visit: Payer: Self-pay | Admitting: Nurse Practitioner
